# Patient Record
Sex: Female | Born: 1935
Health system: Southern US, Community
[De-identification: ages and names within clinical notes are randomized; demographics above are authoritative.]

## PROBLEM LIST (undated history)

## (undated) DIAGNOSIS — F419 Anxiety disorder, unspecified: Secondary | ICD-10-CM

## (undated) DIAGNOSIS — I509 Heart failure, unspecified: Secondary | ICD-10-CM

## (undated) DIAGNOSIS — I2510 Atherosclerotic heart disease of native coronary artery without angina pectoris: Secondary | ICD-10-CM

## (undated) HISTORY — PX: ABDOMINAL HYSTERECTOMY: SHX81

## (undated) HISTORY — PX: TONSILLECTOMY: SUR1361

---

## 1997-08-11 ENCOUNTER — Emergency Department (HOSPITAL_COMMUNITY): Admission: EM | Admit: 1997-08-11 | Discharge: 1997-08-11 | Payer: Self-pay | Admitting: Emergency Medicine

## 2004-07-02 ENCOUNTER — Ambulatory Visit: Payer: Self-pay | Admitting: Internal Medicine

## 2005-08-02 ENCOUNTER — Other Ambulatory Visit: Payer: Self-pay

## 2005-08-02 ENCOUNTER — Emergency Department: Payer: Self-pay | Admitting: Emergency Medicine

## 2005-10-14 ENCOUNTER — Ambulatory Visit: Payer: Self-pay | Admitting: Internal Medicine

## 2006-05-08 ENCOUNTER — Emergency Department: Payer: Self-pay | Admitting: Emergency Medicine

## 2006-09-18 ENCOUNTER — Emergency Department: Payer: Self-pay | Admitting: Emergency Medicine

## 2006-09-18 ENCOUNTER — Other Ambulatory Visit: Payer: Self-pay

## 2007-07-04 ENCOUNTER — Ambulatory Visit: Payer: Self-pay | Admitting: Internal Medicine

## 2007-07-12 ENCOUNTER — Ambulatory Visit: Payer: Self-pay | Admitting: Internal Medicine

## 2008-05-09 ENCOUNTER — Emergency Department: Payer: Self-pay | Admitting: Emergency Medicine

## 2010-07-23 ENCOUNTER — Emergency Department: Payer: Self-pay

## 2010-08-09 ENCOUNTER — Emergency Department: Payer: Self-pay | Admitting: Unknown Physician Specialty

## 2011-05-21 ENCOUNTER — Ambulatory Visit: Payer: Self-pay | Admitting: Gastroenterology

## 2012-05-20 ENCOUNTER — Emergency Department: Payer: Self-pay | Admitting: Emergency Medicine

## 2012-05-20 LAB — COMPREHENSIVE METABOLIC PANEL
Albumin: 3.6 g/dL (ref 3.4–5.0)
Alkaline Phosphatase: 55 U/L (ref 50–136)
Anion Gap: 5 — ABNORMAL LOW (ref 7–16)
BUN: 23 mg/dL — ABNORMAL HIGH (ref 7–18)
Bilirubin,Total: 0.2 mg/dL (ref 0.2–1.0)
Calcium, Total: 8.6 mg/dL (ref 8.5–10.1)
Co2: 32 mmol/L (ref 21–32)
EGFR (African American): >60
EGFR (Non-African Amer.): >60
Glucose: 99 mg/dL (ref 65–99)
Osmolality: 281 (ref 275–301)
SGPT (ALT): 21 U/L (ref 12–78)
Sodium: 139 mmol/L (ref 136–145)
Total Protein: 7.1 g/dL (ref 6.4–8.2)

## 2012-05-20 LAB — URINALYSIS, COMPLETE
Bilirubin,UR: NEGATIVE
Glucose,UR: NEGATIVE mg/dL (ref 0–75)
Hyaline Cast: 1
Ketone: NEGATIVE
Nitrite: NEGATIVE
Ph: 6 (ref 4.5–8.0)
Specific Gravity: 1.017 (ref 1.003–1.030)
WBC UR: <1 /HPF (ref 0–5)

## 2012-05-20 LAB — CBC
HCT: 35.1 % (ref 35.0–47.0)
MCH: 28.8 pg (ref 26.0–34.0)
MCHC: 33.2 g/dL (ref 32.0–36.0)
Platelet: 193 10*3/uL (ref 150–440)
RBC: 4.05 10*6/uL (ref 3.80–5.20)
RDW: 13.7 % (ref 11.5–14.5)
WBC: 5.1 10*3/uL (ref 3.6–11.0)

## 2013-06-26 LAB — CBC WITH DIFFERENTIAL/PLATELET
BASOS ABS: 0.1 10*3/uL (ref 0.0–0.1)
BASOS PCT: 1 %
Eosinophil #: 0.1 10*3/uL (ref 0.0–0.7)
Eosinophil %: 1 %
HCT: 40.6 % (ref 35.0–47.0)
HGB: 13.1 g/dL (ref 12.0–16.0)
LYMPHS ABS: 1.9 10*3/uL (ref 1.0–3.6)
Lymphocyte %: 33.7 %
MCH: 28.5 pg (ref 26.0–34.0)
MCHC: 32.3 g/dL (ref 32.0–36.0)
MCV: 88 fL (ref 80–100)
MONO ABS: 0.4 x10 3/mm (ref 0.2–0.9)
Monocyte %: 6.6 %
NEUTROS PCT: 57.7 %
Neutrophil #: 3.3 10*3/uL (ref 1.4–6.5)
Platelet: 212 10*3/uL (ref 150–440)
RBC: 4.6 10*6/uL (ref 3.80–5.20)
RDW: 13.9 % (ref 11.5–14.5)
WBC: 5.8 10*3/uL (ref 3.6–11.0)

## 2013-06-26 LAB — COMPREHENSIVE METABOLIC PANEL
AST: 24 U/L (ref 15–37)
Albumin: 3.8 g/dL (ref 3.4–5.0)
Alkaline Phosphatase: 58 U/L
Anion Gap: 8 (ref 7–16)
BUN: 22 mg/dL — ABNORMAL HIGH (ref 7–18)
Bilirubin,Total: 0.4 mg/dL (ref 0.2–1.0)
Calcium, Total: 9.6 mg/dL (ref 8.5–10.1)
Chloride: 102 mmol/L (ref 98–107)
Co2: 30 mmol/L (ref 21–32)
Creatinine: 0.92 mg/dL (ref 0.60–1.30)
EGFR (Non-African Amer.): >60
GLUCOSE: 96 mg/dL (ref 65–99)
Osmolality: 283 (ref 275–301)
POTASSIUM: 4.3 mmol/L (ref 3.5–5.1)
SGPT (ALT): 22 U/L (ref 12–78)
Sodium: 140 mmol/L (ref 136–145)
Total Protein: 7.9 g/dL (ref 6.4–8.2)

## 2013-06-26 LAB — TROPONIN I

## 2013-06-26 LAB — LIPASE, BLOOD: LIPASE: 127 U/L (ref 73–393)

## 2013-06-27 ENCOUNTER — Observation Stay: Payer: Self-pay | Admitting: Internal Medicine

## 2013-06-27 LAB — URINALYSIS, COMPLETE
Bilirubin,UR: NEGATIVE
Glucose,UR: NEGATIVE mg/dL (ref 0–75)
Ketone: NEGATIVE
NITRITE: NEGATIVE
PROTEIN: NEGATIVE
Ph: 6 (ref 4.5–8.0)
SPECIFIC GRAVITY: 1.046 (ref 1.003–1.030)
Squamous Epithelial: 1

## 2013-08-13 ENCOUNTER — Emergency Department: Payer: Self-pay | Admitting: Emergency Medicine

## 2013-08-13 LAB — URINALYSIS, COMPLETE
Bilirubin,UR: NEGATIVE
Glucose,UR: NEGATIVE mg/dL (ref 0–75)
Ketone: NEGATIVE
Leukocyte Esterase: NEGATIVE
NITRITE: NEGATIVE
PH: 5 (ref 4.5–8.0)
Protein: NEGATIVE
RBC,UR: <1 /HPF (ref 0–5)
Specific Gravity: 1.023 (ref 1.003–1.030)
Squamous Epithelial: 1
WBC UR: 3 /HPF (ref 0–5)

## 2013-08-13 LAB — COMPREHENSIVE METABOLIC PANEL
ALBUMIN: 3.7 g/dL (ref 3.4–5.0)
ANION GAP: 7 (ref 7–16)
Alkaline Phosphatase: 40 U/L — ABNORMAL LOW
BUN: 24 mg/dL — AB (ref 7–18)
Bilirubin,Total: 0.6 mg/dL (ref 0.2–1.0)
CHLORIDE: 101 mmol/L (ref 98–107)
Calcium, Total: 9.6 mg/dL (ref 8.5–10.1)
Co2: 31 mmol/L (ref 21–32)
Creatinine: 0.95 mg/dL (ref 0.60–1.30)
EGFR (African American): >60
GFR CALC NON AF AMER: 58 — AB
Glucose: 100 mg/dL — ABNORMAL HIGH (ref 65–99)
OSMOLALITY: 282 (ref 275–301)
POTASSIUM: 3.7 mmol/L (ref 3.5–5.1)
SGOT(AST): 23 U/L (ref 15–37)
SGPT (ALT): 26 U/L (ref 12–78)
SODIUM: 139 mmol/L (ref 136–145)
TOTAL PROTEIN: 7.1 g/dL (ref 6.4–8.2)

## 2013-08-13 LAB — CBC WITH DIFFERENTIAL/PLATELET
Basophil #: 0 10*3/uL (ref 0.0–0.1)
Basophil %: 0.9 %
EOS ABS: 0.1 10*3/uL (ref 0.0–0.7)
EOS PCT: 3.4 %
HCT: 36.4 % (ref 35.0–47.0)
HGB: 12 g/dL (ref 12.0–16.0)
LYMPHS PCT: 45.4 %
Lymphocyte #: 1.9 10*3/uL (ref 1.0–3.6)
MCH: 29 pg (ref 26.0–34.0)
MCHC: 32.9 g/dL (ref 32.0–36.0)
MCV: 88 fL (ref 80–100)
MONO ABS: 0.3 x10 3/mm (ref 0.2–0.9)
Monocyte %: 7.3 %
NEUTROS ABS: 1.8 10*3/uL (ref 1.4–6.5)
NEUTROS PCT: 43 %
Platelet: 219 10*3/uL (ref 150–440)
RBC: 4.13 10*6/uL (ref 3.80–5.20)
RDW: 13.4 % (ref 11.5–14.5)
WBC: 4.1 10*3/uL (ref 3.6–11.0)

## 2013-08-17 ENCOUNTER — Other Ambulatory Visit: Payer: Self-pay | Admitting: Emergency Medicine

## 2013-08-17 LAB — CLOSTRIDIUM DIFFICILE(ARMC)

## 2013-08-19 LAB — STOOL CULTURE

## 2013-11-26 ENCOUNTER — Emergency Department: Payer: Self-pay | Admitting: Emergency Medicine

## 2013-11-26 LAB — COMPREHENSIVE METABOLIC PANEL
ALT: 23 U/L
ANION GAP: 8 (ref 7–16)
AST: 21 U/L (ref 15–37)
Albumin: 3.7 g/dL (ref 3.4–5.0)
Alkaline Phosphatase: 52 U/L
BUN: 26 mg/dL — AB (ref 7–18)
Bilirubin,Total: 0.4 mg/dL (ref 0.2–1.0)
CO2: 26 mmol/L (ref 21–32)
CREATININE: 1.2 mg/dL (ref 0.60–1.30)
Calcium, Total: 8.9 mg/dL (ref 8.5–10.1)
Chloride: 103 mmol/L (ref 98–107)
EGFR (African American): 56 — ABNORMAL LOW
GFR CALC NON AF AMER: 46 — AB
Glucose: 125 mg/dL — ABNORMAL HIGH (ref 65–99)
OSMOLALITY: 280 (ref 275–301)
POTASSIUM: 3.7 mmol/L (ref 3.5–5.1)
Sodium: 137 mmol/L (ref 136–145)
Total Protein: 7.6 g/dL (ref 6.4–8.2)

## 2013-11-26 LAB — URINALYSIS, COMPLETE
Bacteria: NONE SEEN
Bilirubin,UR: NEGATIVE
Glucose,UR: NEGATIVE mg/dL (ref 0–75)
Ketone: NEGATIVE
Leukocyte Esterase: NEGATIVE
NITRITE: NEGATIVE
PH: 5 (ref 4.5–8.0)
Protein: NEGATIVE
Specific Gravity: 1.046 (ref 1.003–1.030)
Squamous Epithelial: 1
WBC UR: <1 /HPF (ref 0–5)

## 2013-11-26 LAB — CBC
HCT: 39.5 % (ref 35.0–47.0)
HGB: 12.4 g/dL (ref 12.0–16.0)
MCH: 28 pg (ref 26.0–34.0)
MCHC: 31.5 g/dL — AB (ref 32.0–36.0)
MCV: 89 fL (ref 80–100)
PLATELETS: 276 10*3/uL (ref 150–440)
RBC: 4.44 10*6/uL (ref 3.80–5.20)
RDW: 13 % (ref 11.5–14.5)
WBC: 9.4 10*3/uL (ref 3.6–11.0)

## 2013-11-26 LAB — CLOSTRIDIUM DIFFICILE(ARMC)

## 2013-11-26 LAB — LIPASE, BLOOD: Lipase: 499 U/L — ABNORMAL HIGH (ref 73–393)

## 2013-11-26 LAB — SEDIMENTATION RATE: Erythrocyte Sed Rate: 18 mm/hr (ref 0–30)

## 2013-11-28 LAB — STOOL CULTURE

## 2013-11-29 ENCOUNTER — Inpatient Hospital Stay: Payer: Self-pay | Admitting: Internal Medicine

## 2013-11-29 LAB — CBC WITH DIFFERENTIAL/PLATELET
Basophil #: 0.1 10*3/uL (ref 0.0–0.1)
Basophil %: 1.2 %
Eosinophil #: 0.1 10*3/uL (ref 0.0–0.7)
Eosinophil %: 1.1 %
HCT: 35.5 % (ref 35.0–47.0)
HGB: 11.3 g/dL — AB (ref 12.0–16.0)
LYMPHS ABS: 1.6 10*3/uL (ref 1.0–3.6)
LYMPHS PCT: 33.2 %
MCH: 28.1 pg (ref 26.0–34.0)
MCHC: 31.7 g/dL — AB (ref 32.0–36.0)
MCV: 89 fL (ref 80–100)
MONO ABS: 0.4 x10 3/mm (ref 0.2–0.9)
Monocyte %: 7.4 %
NEUTROS PCT: 57.1 %
Neutrophil #: 2.8 10*3/uL (ref 1.4–6.5)
Platelet: 246 10*3/uL (ref 150–440)
RBC: 4 10*6/uL (ref 3.80–5.20)
RDW: 12.9 % (ref 11.5–14.5)
WBC: 4.9 10*3/uL (ref 3.6–11.0)

## 2013-11-29 LAB — URINALYSIS, COMPLETE
Bacteria: NONE SEEN
Bilirubin,UR: NEGATIVE
Glucose,UR: NEGATIVE mg/dL (ref 0–75)
Hyaline Cast: 5
Ketone: NEGATIVE
NITRITE: NEGATIVE
Ph: 6 (ref 4.5–8.0)
Protein: NEGATIVE
SPECIFIC GRAVITY: 1.016 (ref 1.003–1.030)
Squamous Epithelial: <1
WBC UR: 1 /HPF (ref 0–5)

## 2013-11-29 LAB — BASIC METABOLIC PANEL
Anion Gap: 6 — ABNORMAL LOW (ref 7–16)
BUN: 16 mg/dL (ref 7–18)
CREATININE: 1.58 mg/dL — AB (ref 0.60–1.30)
Calcium, Total: 8.8 mg/dL (ref 8.5–10.1)
Chloride: 103 mmol/L (ref 98–107)
Co2: 31 mmol/L (ref 21–32)
EGFR (African American): 41 — ABNORMAL LOW
GFR CALC NON AF AMER: 34 — AB
Glucose: 110 mg/dL — ABNORMAL HIGH (ref 65–99)
Osmolality: 281 (ref 275–301)
Potassium: 3.3 mmol/L — ABNORMAL LOW (ref 3.5–5.1)
SODIUM: 140 mmol/L (ref 136–145)

## 2013-11-29 LAB — TROPONIN I: Troponin-I: <0.02 ng/mL

## 2013-11-29 LAB — LIPASE, BLOOD: Lipase: 168 U/L (ref 73–393)

## 2013-11-30 LAB — CBC WITH DIFFERENTIAL/PLATELET
BASOS PCT: 0.9 %
Basophil #: 0 10*3/uL (ref 0.0–0.1)
EOS PCT: 1.9 %
Eosinophil #: 0.1 10*3/uL (ref 0.0–0.7)
HCT: 33.8 % — ABNORMAL LOW (ref 35.0–47.0)
HGB: 10.7 g/dL — ABNORMAL LOW (ref 12.0–16.0)
LYMPHS PCT: 42.5 %
Lymphocyte #: 2.2 10*3/uL (ref 1.0–3.6)
MCH: 28.3 pg (ref 26.0–34.0)
MCHC: 31.8 g/dL — AB (ref 32.0–36.0)
MCV: 89 fL (ref 80–100)
MONO ABS: 0.4 x10 3/mm (ref 0.2–0.9)
Monocyte %: 8.1 %
Neutrophil #: 2.4 10*3/uL (ref 1.4–6.5)
Neutrophil %: 46.6 %
Platelet: 214 10*3/uL (ref 150–440)
RBC: 3.8 10*6/uL (ref 3.80–5.20)
RDW: 12.9 % (ref 11.5–14.5)
WBC: 5.1 10*3/uL (ref 3.6–11.0)

## 2013-11-30 LAB — BASIC METABOLIC PANEL
Anion Gap: 4 — ABNORMAL LOW (ref 7–16)
BUN: 18 mg/dL (ref 7–18)
CHLORIDE: 108 mmol/L — AB (ref 98–107)
CREATININE: 1.6 mg/dL — AB (ref 0.60–1.30)
Calcium, Total: 8.3 mg/dL — ABNORMAL LOW (ref 8.5–10.1)
Co2: 31 mmol/L (ref 21–32)
GFR CALC AF AMER: 40 — AB
GFR CALC NON AF AMER: 33 — AB
GLUCOSE: 90 mg/dL (ref 65–99)
Osmolality: 286 (ref 275–301)
Potassium: 3.2 mmol/L — ABNORMAL LOW (ref 3.5–5.1)
SODIUM: 143 mmol/L (ref 136–145)

## 2013-12-01 LAB — URINE CULTURE

## 2013-12-11 ENCOUNTER — Ambulatory Visit: Payer: Self-pay | Admitting: Gastroenterology

## 2013-12-17 ENCOUNTER — Ambulatory Visit: Payer: Self-pay | Admitting: Gastroenterology

## 2014-03-15 ENCOUNTER — Other Ambulatory Visit: Payer: Self-pay | Admitting: Gastroenterology

## 2014-03-15 DIAGNOSIS — K5710 Diverticulosis of small intestine without perforation or abscess without bleeding: Secondary | ICD-10-CM

## 2014-03-27 ENCOUNTER — Telehealth: Payer: Self-pay | Admitting: Diagnostic Radiology

## 2014-03-27 ENCOUNTER — Ambulatory Visit
Admission: RE | Admit: 2014-03-27 | Discharge: 2014-03-27 | Disposition: A | Discharge status: Home / Self Care | Payer: Commercial Managed Care - HMO | Source: Ambulatory Visit | Attending: Gastroenterology | Admitting: Gastroenterology

## 2014-03-27 DIAGNOSIS — K5710 Diverticulosis of small intestine without perforation or abscess without bleeding: Secondary | ICD-10-CM

## 2014-05-19 ENCOUNTER — Emergency Department: Payer: Self-pay | Admitting: Emergency Medicine

## 2014-05-19 DIAGNOSIS — R001 Bradycardia, unspecified: Secondary | ICD-10-CM | POA: Diagnosis not present

## 2014-05-19 DIAGNOSIS — R1031 Right lower quadrant pain: Secondary | ICD-10-CM | POA: Diagnosis not present

## 2014-05-19 DIAGNOSIS — R61 Generalized hyperhidrosis: Secondary | ICD-10-CM | POA: Diagnosis not present

## 2014-05-19 DIAGNOSIS — R112 Nausea with vomiting, unspecified: Secondary | ICD-10-CM | POA: Diagnosis not present

## 2014-05-19 DIAGNOSIS — R1032 Left lower quadrant pain: Secondary | ICD-10-CM | POA: Diagnosis not present

## 2014-05-19 DIAGNOSIS — R1110 Vomiting, unspecified: Secondary | ICD-10-CM | POA: Diagnosis not present

## 2014-05-19 DIAGNOSIS — K5730 Diverticulosis of large intestine without perforation or abscess without bleeding: Secondary | ICD-10-CM | POA: Diagnosis not present

## 2014-05-19 DIAGNOSIS — Z79899 Other long term (current) drug therapy: Secondary | ICD-10-CM | POA: Diagnosis not present

## 2014-05-19 DIAGNOSIS — R51 Headache: Secondary | ICD-10-CM | POA: Diagnosis not present

## 2014-05-19 DIAGNOSIS — R109 Unspecified abdominal pain: Secondary | ICD-10-CM | POA: Diagnosis not present

## 2014-05-19 DIAGNOSIS — R197 Diarrhea, unspecified: Secondary | ICD-10-CM | POA: Diagnosis not present

## 2014-05-19 DIAGNOSIS — Z791 Long term (current) use of non-steroidal anti-inflammatories (NSAID): Secondary | ICD-10-CM | POA: Diagnosis not present

## 2014-05-19 DIAGNOSIS — Z7982 Long term (current) use of aspirin: Secondary | ICD-10-CM | POA: Diagnosis not present

## 2014-06-13 DIAGNOSIS — E780 Pure hypercholesterolemia: Secondary | ICD-10-CM | POA: Diagnosis not present

## 2014-06-13 DIAGNOSIS — R1013 Epigastric pain: Secondary | ICD-10-CM | POA: Diagnosis not present

## 2014-06-13 DIAGNOSIS — R1084 Generalized abdominal pain: Secondary | ICD-10-CM | POA: Diagnosis not present

## 2014-06-13 DIAGNOSIS — Z79899 Other long term (current) drug therapy: Secondary | ICD-10-CM | POA: Diagnosis not present

## 2014-06-13 DIAGNOSIS — I10 Essential (primary) hypertension: Secondary | ICD-10-CM | POA: Diagnosis not present

## 2014-06-22 NOTE — Discharge Summary (Signed)
PATIENT NAME:  Teresa Holt, Harmony S MR#:  [id number] DATE OF BIRTH:  1934/09/16  DATE OF ADMISSION:  11/29/2013 DATE OF DISCHARGE:  11/30/2013  ADMISSION DIAGNOSES:  1.  Acute kidney injury.  2.  Altered mental status.    DISCHARGE DIAGNOSES:   1.  Metabolic encephalopathy secondary to dehydration, acute kidney injury, as well as pain medications recently prescribed.  2.  Acute kidney injury.  3.  Acute cystitis, no hematuria.  4.  Abdominal pain, resolved.  5.  Hypokalemia.   CONSULTATIONS: None.   LABORATORIES AT DISCHARGE: White blood cells 5.1, hemoglobin 10.7, hematocrit 34, platelets are 214,000. Sodium 143, potassium 3.2, chloride 108, bicarbonate 31, BUN 18, creatinine 1.6, glucose of 90, calcium 8.3.   HOSPITAL COURSE: This is a 80 year old female who presented 3 days prior to admission to the ER with abdominal pain, found to have a possible inflammatory bowel disease, was sent home on antibiotics as well as pain medications and antiemetics. She presented with metabolic encephalopathy. For further details please refer to the H and P.    1.  Metabolic encephalopathy, likely combination of dehydration and pain medications and her mild cystitis. Her symptoms have resolved. CT of the head was negative. She is back to her baseline. I did ambulate the patient myself, the patient is ambulating well, not complaining of dizziness or lightheadedness. She is oriented x 3.  2.  Acute kidney injury from dehydration. Her creatinine has not much improved, but she was dehydrated. We will continue IV fluids. She will have a repeat BMP next week with her PCP. 3.  Acute cystitis, no hematuria. The patient is already on antibiotics that she was administered in the ER, so she will continue on these antibiotics.  4.  Abdominal pain, resolved. The patient had a CT a few days ago with possible Crohn disease and she will follow up with GI.  5.  Hypokalemia, which was repleted prior to discharge.   DISCHARGE  MEDICATIONS:  1.  Metoprolol 50 mg daily.  2.  Ciprofloxacin 500 q. 12 hours for 10 days.  3.  Flagyl 500 mg q. 8 hours for 10 days.   DISCHARGE DIET: Low sodium.   DISCHARGE ACTIVITY: As tolerated.   DISCHARGE FOLLOWUP:  1.  The patient has a follow-up next week with GI.  2.  She will need to follow up next week with Dr. Barry BrunnerGlenn Willett for a BMP.   TIME SPENT: Approximately 35 minutes.   Plan of care was discussed with the patient's family. The patient is stable for discharge.    ____________________________ Aboubacar Matsuo P. Juliene PinaMody, MD spm:bu D: 11/30/2013 13:27:26 ET T: 11/30/2013 15:56:59 ET JOB#: [id number]  cc: Maliek Schellhorn P. Juliene PinaMody, MD, <Dictator> Jorje GuildGlenn R. Beckey DowningWillett, MD Scot Junobert T. Elliott, MD  Jonthan Leite P Esmond Hinch MD ELECTRONICALLY SIGNED 11/30/2013 20:05

## 2014-06-22 NOTE — H&P (Signed)
PATIENT NAME:  Teresa Holt, Teresa Holt MR#:  [id number] DATE OF BIRTH:  1934/12/10  DATE OF ADMISSION:  11/29/2013  PRIMARY CARE PHYSICIAN: Dr. Barry Brunner.    REFERRING EMERGENCY ROOM PHYSICIAN: Dr. Mayford Knife.    CHIEF COMPLAINT: Altered mental status.   HISTORY OF PRESENT ILLNESS: The patient is a 80 year old pleasant African-American female who is brought into the ED by her husband for altered mental status. Actually the patient was seen by the ED physician approximately 3 days ago on last Monday for abdominal pain and a CAT scan of the abdomen and pelvis was done at that time. CT performed on September 28 has revealed irregular wall thickening and mucosal enhancement of numerous distal ileal loops consistent with small bowel inflammation and there was a concern for Crohn disease. The patient was started on Flagyl and ciprofloxacin and she was sent home. Since the patient was seen and started on this medications the family noticed her being altered.  They were concerned that the patient has intermittent episodes of confusion. There are also reporting that the patient is not eating much, had some abdominal pain and decreased p.o. intake. As the patient is not doing good she is brought into the ED.  The patient was feeling weak and having difficulty to stand up. CAT scan of the head was done which revealed no acute findings. The patient's creatinine is elevated which was at 1.20 on September 20, today it is at 1.58, her potassium is low at 3.3. The urinalysis has revealed trace leukocyte esterase and negative nitrate. Her stool for Clostridium difficile toxin performed on September 20 was found to be negative. Hospitalist team is called to admit the patient regarding altered mental status.  During my examination the patient is not complaining of any abdominal pain, but the family is concerned about her confusion. The patient is communicating and answering questions appropriately, but with low voice during my  examination.   PAST MEDICAL HISTORY: CVA without any residual deficits, hypertension, congestive heart failure with undocumented EF.   PAST SURGICAL HISTORY: None.   ALLERGIES: No known drug allergies.   PSYCHOSOCIAL HISTORY: Lives at home with husband. No history of smoking, alcohol, or illicit drug usage.   FAMILY HISTORY: Positive for diabetes as well as stroke.    REVIEW OF SYSTEMS:   CONSTITUTIONAL:  Denies any fever. Complaining of fatigue and weakness.  EYES: Denies blurry vision, double vision, glaucoma.  ENT: Denies epistaxis, discharge.  RESPIRATION: Denies cough, COPD.   CARDIOVASCULAR: No chest pain, palpitations, syncope.  GASTROINTESTINAL: Denies nausea, vomiting, diarrhea, abdominal pain. No hematemesis, no melena.  GENITOURINARY: No dysuria or hematuria.  ENDOCRINE: Denies polyuria, nocturia, thyroid problems.  GYNECOLOGIC AND BREASTS:  Denies breast masses or vaginal discharge.  HEMATOLOGIC:  No anemia.  No easy bruising or bleeding.  INTEGUMENTARY: No acne, rash, lesions.  MUSCULOSKELETAL: No joint pain in the neck and back. Denies gout.   NEUROLOGIC;  Denies any vertigo, ataxia. Complaining of weakness.  PSYCHIATRIC: No ADD, OCD.     HOME MEDICATIONS: Metoprolol succinate 50 mg p.o. once daily, ciprofloxacin 500 mg p.o. q. 12 hours, Tylenol 1-2 tablets every 4-6 hours as needed, metronidazole 500 mg p.o. q. 8 hours, promethazine 25 mg 1-2 tablets p.o. q. 4-6 hours as needed for nausea and vomiting.   PHYSICAL EXAMINATION:  VITAL SIGNS: Temperature 97.9, pulse 54, respirations 18, blood pressure 116/74, pulse oximetry is 98%.  GENERAL APPEARANCE: Not in acute distress. Moderately built and thin-looking female in no apparent distress.  HEENT: Normocephalic, atraumatic. Pupils are equally reacting to light and accommodation. No scleral icterus. No conjunctival injection. No sinus tenderness. No postnasal drip. Dry mucus membranes.  NECK: Supple. No JVD. No  thyromegaly. Range of motion is intact.  LUNGS: Clear to auscultation bilaterally. No accessory muscle use. No anterior chest wall tenderness on palpation.  CARDIAC: S1, S2 normal. Regular rate and rhythm. No murmurs. GASTROINTESTINAL: Soft. Bowel sounds are positive in all 4 quadrants. Nontender, nondistended. No masses, no hepatosplenomegaly. No masses felt.   NEUROLOGIC:  Awake and alert, oriented x 3. Answering questions appropriately. Is complaining of generalized weakness. Reflexes are 2 +. Motor and sensory are grossly intact. EXTREMITIES: No edema. No cyanosis. No clubbing.  SKIN: Warm to touch. Decreased turgor.  No rashes or lesions.   LABORATORY AND IMAGING STUDIES: CAT scan of the head, no acute findings. Troponin less than 0.02. WBC 4.9, hemoglobin is 11.3, hematocrit and platelets are normal. Stool for Clostridium difficile toxin performed on September 20 is negative. Urinalysis yellow in color, blood is 1 +, nitrates negative, leukocyte esterase is trace. BMP reveals a glucose 110, BUN 16, creatinine 1.58, sodium 140, potassium 3.3, chloride 103, CO2 31. Anion gap is 6. Serum osmolality and calcium are normal. Lipase which was elevated at 499 on September 20 is back to normal at 168.   ASSESSMENT AND PLAN:  A 80 year old African-American female brought into the Emergency Department by family members for altered mental status, who was evaluated by Emergency Department physician 3 days ago for abdominal pain and notices some small bowel inflammation concerning for Crohn disease, was sent home with p.o. antibiotics Cipro and Flagyl, will be admitted with the following assessment and plan. A CAT scan of the head is negative.   ASSESSMENT AND PLAN:  1.  Altered mental status secondary to dehydration and acute cystitis.  2.  Dehydration secondary to poor p.o. intake leading to acute kidney injury. We will provide gentle hydration with IV fluids and monitor fluid overload in view of her  congestive heart failure.  3.  Acute cystitis by urinalysis and small bowel inflammatory disease with a concern for Crohn disease.  We will get urine culture. The patient will be continued on IV levofloxacin and Flagyl.  4.  History of hypertension. Blood pressure is stable.  We will resume her home medications. 5.  Chronic congestive heart failure, ejection fraction is not documented, not fluid overloaded at this time.  6.  We will provide GI and DVT prophylaxis.   She is full code. Her husband and daughter are the medical power of attorney. Plan of care was discussed in detail with the patient and her husband at bedside. They both verbalized understanding of the plan.   TOTAL TIME SPENT ON ADMISSION: 45 minutes.     ____________________________ Ramonita LabAruna Alura Olveda, MD ag:bu D: 11/29/2013 19:27:25 ET T: 11/29/2013 20:22:15 ET JOB#: [id number]  cc: Ramonita LabAruna Sira Adsit, MD, <Dictator> Jorje GuildGlenn R. Beckey DowningWillett, MD Ramonita LabARUNA Evaline Waltman MD ELECTRONICALLY SIGNED 11/30/2013 14:02

## 2014-06-22 NOTE — Discharge Summary (Signed)
PATIENT NAME:  Teresa Holt, Teresa Holt MR#:  [id number] DATE OF BIRTH:  03-25-1934  DATE OF ADMISSION:  06/27/2013 DATE OF DISCHARGE:  06/27/2013  ADMITTING PHYSICIAN: Dr. Angelica Ranavid Hower   DISCHARGING PHYSICIAN: Dr. Enid Baasadhika Jaquetta Currier  PRIMARY CARE PHYSICIAN: Dr. Barry BrunnerGlenn Willett   CONSULTATIONS IN HOSPITAL: None.   DISCHARGE DIAGNOSES:  1. Intractable nausea, vomiting, and diarrhea secondary to acute colitis.  2. Hypertension.   DISCHARGE HOME MEDICATIONS:  1. Vitamin C 500 mg p.o. daily.  2. Clonidine 0.1 mg p.o. 3 times a day.  3. Lovastatin 20 mg p.o. daily.  4. Aspirin 81 mg p.o. daily.  5. Metoprolol 50 mg p.o. daily.  6. HCTZ 12.5 mg p.o. daily.  7. Flagyl 500 mg p.o. q. 8 hours for 9 days.  8. Ciprofloxacin 500 mg p.o. b.i.d. for 9 days.  9. Tramadol 50 mg p.o. q. 6 hours p.r.n. for pain.  10. Phenergan 25 mg q. 6 hours p.r.n. for nausea and vomiting.   DISCHARGE DIET: Low-sodium diet.   DISCHARGE ACTIVITY: As tolerated.    FOLLOWUP INSTRUCTIONS: PCP followup in 1-2 weeks.   LABS AND IMAGING STUDIES PRIOR TO DISCHARGE: Urinalysis is negative for any infection. CT of the abdomen and pelvis with contrast showing changes in descending portions of the colon suggests colitis, also sigmoid diverticulosis without diverticulitis. No evidence of obstruction seen. No hepatobiliary abnormality and stable cysts in both kidneys seen.   WBC 5.8, hemoglobin 13.1, hematocrit 40.6, platelet count 212.   Sodium 140, potassium 4.3, chloride 102, bicarbonate 30, BUN 22, creatinine 0.92, glucose 96, and calcium of 9.6.   ALT 22, AST 24, alkaline phosphatase 58, total bilirubin 0.4, albumin of 3.8, lipase is 127.   BRIEF HOSPITAL COURSE: The patient is a 80 year old obese female with past medical history significant for hypertension, a prior history of CVA without any residual neurological deficits presents to the hospital with  intractable nausea, vomiting, abdominal pain. CT of the abdomen did reveal  that she has acute colitis.  1. Acute colitis with intractable nausea, vomiting and diarrhea, and also abdominal pain. She was started on IV fluids and Cipro and Flagyl. Symptoms improved while in the hospital so is being discharged home. She was able to tolerate a regular diet prior to discharge. She had 1 diarrheal episode where she had some streaks of blood. Could be from hemorrhoids.  2. Hypertension.  All her home medications are being continued at the time of discharge.  3. Hyperlipemia. The patient takes statin at home.  Her course has been otherwise uneventful in the hospital.   DISCHARGE CONDITION: Stable.   DISCHARGE DISPOSITION: Home.   TIME SPENT ON DISCHARGE: 45 minutes.    ____________________________ Enid Baasadhika Zeya Balles, MD rk:dd D: 06/27/2013 14:23:40 ET T: 06/27/2013 19:07:21 ET JOB#: [id number]  cc: Enid Baasadhika Zelta Enfield, MD, <Dictator> Jorje GuildGlenn R. Beckey DowningWillett, MD  Enid BaasADHIKA Emerita Berkemeier MD ELECTRONICALLY SIGNED 07/18/2013 13:59

## 2014-06-22 NOTE — H&P (Signed)
PATIENT NAME:  Teresa Holt, Teresa Holt MR#:  [id number] DATE OF BIRTH:  [birth year]  DATE OF ADMISSION:  06/27/2013  REFERRING PHYSICIAN: Dr. Leanord AsalNormon  PRIMARY CARE PHYSICIAN: Dr. Beckey DowningWillett.   CHIEF COMPLAINT: Abdominal pain.    HISTORY OF PRESENT ILLNESS: This is a 80 year old African American female with a past medical history of hypertension, presenting with abdominal pain. She describes one month duration of intermittent abdominal pain, periumbilical in location, bloating in quality, however, for the last one day it has been persistent, 10 out of 10 in intensity, bloating and cramping, without radiation. No worsening or relieving factors. No associated fevers or chills. However, has had associated diarrhea, which she describes multiple bouts of watery diarrhea with nausea and emesis. A few bouts of vomiting of nonbloody, nonbilious emesis. In the Emergency Department, apparently she was scheduled to be discharged; however, had repeated bouts of emesis and nausea, with inability to tolerate p.o.   REVIEW OF SYSTEMS:  CONSTITUTIONAL: Positive for fatigue, weakness. Denies fevers, chills. EYES: Denies blurry vision, double vision, eye pain.  HEENT: Denies tinnitus, ear pain, hearing loss.  RESPIRATORY: Denies cough, wheeze, shortness of breath.  CARDIOVASCULAR: Denies chest pain, palpitations, edema.  GASTROINTESTINAL: Positive for nausea, vomiting, diarrhea, abdominal pain, as described above.  GENITOURINARY: Denies dysuria, hematuria.  ENDOCRINE: Denies nocturia or thyroid problems.  HEMATOLOGY AND LYMPHATIC: Denies easy bruising, bleeding. SKIN: Denies rash or lesion.  MUSCULOSKELETAL: Denies pain in neck, back, shoulder, hips or arthritic symptoms. NEUROLOGIC: Denies paralysis, paresthesias. PSYCHIATRIC: Denies anxiety or depressive symptoms. Otherwise, full review of systems performed by me is negative.   PAST MEDICAL HISTORY: CVA without residual deficits, hypertension, congestive heart failure,  with undocumented ejection fraction.   SOCIAL HISTORY: Denies alcohol, tobacco or drug usage.   FAMILY HISTORY: Positive for diabetes as well as stroke.   ALLERGIES: No known drug allergies.   HOME MEDICATIONS: Include aspirin 81 mg p.o. daily, tramadol 50 mg p.o. q.4h. as needed for pain, clonidine 0.1 mg p.o. 3 times daily, lovastatin 20 mg p.o. daily, metoprolol 50 mg p.o. daily, hydrochlorothiazide 12.5 mg p.o. daily, vitamin C 500 mg p.o. daily.   PHYSICAL EXAMINATION: VITAL SIGNS: Temperature 98, heart rate 58, respirations 20, blood pressure 174/95, satting 100% on room air. Weight 90.7 kg, BMI 32.3.  GENERAL: Obese Caucasian female who appears weak at this time, though in no acute distress.  HEAD: Normocephalic, atraumatic.  EYES: Pupils equal, round, reactive to light. Extraocular muscles intact. No scleral icterus.  MOUTH: Dry mucosal membrane. Dentition intact. No abscess noted.  EARS, NOSE, AND THROAT: Throat clear, without exudates. No external lesions.  NECK: Supple. No thyromegaly. No nodules. No JVD.  PULMONARY: Clear to auscultation bilaterally, without wheeze, rales, rhonchi. No accessory muscle use. Good respiratory effort. CHEST: Nontender to palpation.  CARDIOVASCULAR: S1, S2. Regular rate and rhythm. No murmurs, rubs or gallops. No edema. Pulses 2+ bilaterally.  GASTROINTESTINAL: Soft, nondistended, tender to palpation, periumbilical and suprapubic regions, without rebound, guarding or motion tenderness. Positive bowel sounds. No hepatosplenomegaly.  MUSCULOSKELETAL: No swelling, clubbing, or edema. Range of motion full in all extremities.  NEUROLOGIC: Cranial nerves II through XII intact. No gross focal neurologic deficit. Sensation intact, reflexes intact.  SKIN: No ulcerations, lesions, rash, cyanosis. Skin warm, dry. Turgor intact.  PSYCHIATRIC: Mood and affect within normal limits. The patient is awake, alert and oriented x3. Insight and judgment intact.    LABORATORY DATA: Sodium 140, potassium 4.3, chloride 102, bicarbonate 22, BUN 0.9, glucose 96. LFTs within normal  limits. Troponin I less than 0.02. WBC 5.8, hemoglobin 13.1, platelets of 212. Urinalysis negative for evidence of infection. Lactic acid 1.1. CT abdomen and pelvis performed with contrast, revealing transverse and descending portions of the colon suggestive of colitis, without acute evidence of diverticulitis; however, there is mention of diverticulosis.   ASSESSMENT AND PLAN: A 80 year old [place] American female with a history of hypertension, presenting with abdominal pain, describing what has been intermittent, with associated diarrhea and current emesis. She was doing fairly well in the Emergency Department, however, prior to discharge had recurrent nausea, vomiting and diarrhea, making her intolerant of oral medications.  1.  Intractable nausea, vomiting, diarrhea. We will provide IV fluid hydration with normal saline. We will provide supportive care with Zofran.  2.  Colitis. Place on antibiotic coverage with Cipro, Flagyl.  3.  Hypertension. Will hold hydrochlorothiazide for now, given diarrhea and vomiting. Will continue her Lopressor, as well as clonidine.  4.  Hyperlipidemia. Continue with her statin therapy.  5.  Deep venous thrombosis prophylaxis with heparin subcutaneous.   CODE STATUS: The patient is full code.   TIME SPENT: 45 minutes.   ____________________________ Cletis Athens. Hower, MD dkh:cg D: 06/27/2013 00:53:02 ET T: 06/27/2013 01:41:40 ET JOB#: [id number]  cc: Cletis Athens. Hower, MD, <Dictator> DAVID Synetta Shadow MD ELECTRONICALLY SIGNED 07/04/2013 20:25

## 2014-06-24 LAB — SURGICAL PATHOLOGY

## 2014-08-16 DIAGNOSIS — K5790 Diverticulosis of intestine, part unspecified, without perforation or abscess without bleeding: Secondary | ICD-10-CM | POA: Diagnosis not present

## 2014-08-16 DIAGNOSIS — R109 Unspecified abdominal pain: Secondary | ICD-10-CM | POA: Diagnosis not present

## 2014-08-16 DIAGNOSIS — I10 Essential (primary) hypertension: Secondary | ICD-10-CM | POA: Diagnosis not present

## 2014-08-16 DIAGNOSIS — K580 Irritable bowel syndrome with diarrhea: Secondary | ICD-10-CM | POA: Diagnosis not present

## 2014-08-16 DIAGNOSIS — E785 Hyperlipidemia, unspecified: Secondary | ICD-10-CM | POA: Diagnosis not present

## 2014-08-16 DIAGNOSIS — M47819 Spondylosis without myelopathy or radiculopathy, site unspecified: Secondary | ICD-10-CM | POA: Diagnosis not present

## 2014-08-16 DIAGNOSIS — G8929 Other chronic pain: Secondary | ICD-10-CM | POA: Diagnosis not present

## 2014-08-16 DIAGNOSIS — Z90710 Acquired absence of both cervix and uterus: Secondary | ICD-10-CM | POA: Diagnosis not present

## 2014-08-16 DIAGNOSIS — K219 Gastro-esophageal reflux disease without esophagitis: Secondary | ICD-10-CM | POA: Diagnosis not present

## 2014-12-19 DIAGNOSIS — Z79899 Other long term (current) drug therapy: Secondary | ICD-10-CM | POA: Diagnosis not present

## 2014-12-19 DIAGNOSIS — Z23 Encounter for immunization: Secondary | ICD-10-CM | POA: Diagnosis not present

## 2014-12-19 DIAGNOSIS — E7800 Pure hypercholesterolemia, unspecified: Secondary | ICD-10-CM | POA: Diagnosis not present

## 2014-12-19 DIAGNOSIS — Z0000 Encounter for general adult medical examination without abnormal findings: Secondary | ICD-10-CM | POA: Diagnosis not present

## 2015-03-28 DIAGNOSIS — I10 Essential (primary) hypertension: Secondary | ICD-10-CM | POA: Diagnosis not present

## 2015-03-28 DIAGNOSIS — K5732 Diverticulitis of large intestine without perforation or abscess without bleeding: Secondary | ICD-10-CM | POA: Diagnosis not present

## 2015-03-28 DIAGNOSIS — K5730 Diverticulosis of large intestine without perforation or abscess without bleeding: Secondary | ICD-10-CM | POA: Diagnosis not present

## 2015-03-28 DIAGNOSIS — I700 Atherosclerosis of aorta: Secondary | ICD-10-CM | POA: Diagnosis not present

## 2015-03-28 DIAGNOSIS — M8580 Other specified disorders of bone density and structure, unspecified site: Secondary | ICD-10-CM | POA: Diagnosis not present

## 2015-03-28 DIAGNOSIS — Z90710 Acquired absence of both cervix and uterus: Secondary | ICD-10-CM | POA: Diagnosis not present

## 2015-03-28 DIAGNOSIS — Z79899 Other long term (current) drug therapy: Secondary | ICD-10-CM | POA: Diagnosis not present

## 2015-03-28 DIAGNOSIS — M47819 Spondylosis without myelopathy or radiculopathy, site unspecified: Secondary | ICD-10-CM | POA: Diagnosis not present

## 2015-03-28 DIAGNOSIS — E785 Hyperlipidemia, unspecified: Secondary | ICD-10-CM | POA: Diagnosis not present

## 2015-03-28 DIAGNOSIS — K219 Gastro-esophageal reflux disease without esophagitis: Secondary | ICD-10-CM | POA: Diagnosis not present

## 2015-03-28 DIAGNOSIS — R1032 Left lower quadrant pain: Secondary | ICD-10-CM | POA: Diagnosis not present

## 2015-04-03 DIAGNOSIS — K5792 Diverticulitis of intestine, part unspecified, without perforation or abscess without bleeding: Secondary | ICD-10-CM | POA: Diagnosis not present

## 2015-06-19 DIAGNOSIS — I10 Essential (primary) hypertension: Secondary | ICD-10-CM | POA: Diagnosis not present

## 2015-06-19 DIAGNOSIS — Z79899 Other long term (current) drug therapy: Secondary | ICD-10-CM | POA: Diagnosis not present

## 2015-06-19 DIAGNOSIS — K219 Gastro-esophageal reflux disease without esophagitis: Secondary | ICD-10-CM | POA: Diagnosis not present

## 2015-06-19 DIAGNOSIS — E7800 Pure hypercholesterolemia, unspecified: Secondary | ICD-10-CM | POA: Diagnosis not present

## 2015-10-02 ENCOUNTER — Emergency Department: Payer: Commercial Managed Care - HMO

## 2015-10-02 ENCOUNTER — Emergency Department
Admission: EM | Admit: 2015-10-02 | Discharge: 2015-10-02 | Disposition: A | Discharge status: Home / Self Care | Payer: Commercial Managed Care - HMO | Attending: Emergency Medicine | Admitting: Emergency Medicine

## 2015-10-02 ENCOUNTER — Encounter: Payer: Self-pay | Admitting: Emergency Medicine

## 2015-10-02 DIAGNOSIS — R1013 Epigastric pain: Secondary | ICD-10-CM | POA: Diagnosis not present

## 2015-10-02 DIAGNOSIS — I2510 Atherosclerotic heart disease of native coronary artery without angina pectoris: Secondary | ICD-10-CM | POA: Diagnosis not present

## 2015-10-02 DIAGNOSIS — I509 Heart failure, unspecified: Secondary | ICD-10-CM | POA: Diagnosis not present

## 2015-10-02 DIAGNOSIS — K5730 Diverticulosis of large intestine without perforation or abscess without bleeding: Secondary | ICD-10-CM | POA: Diagnosis not present

## 2015-10-02 HISTORY — DX: Heart failure, unspecified: I50.9

## 2015-10-02 HISTORY — DX: Atherosclerotic heart disease of native coronary artery without angina pectoris: I25.10

## 2015-10-02 HISTORY — DX: Anxiety disorder, unspecified: F41.9

## 2015-10-02 LAB — URINALYSIS COMPLETE WITH MICROSCOPIC (ARMC ONLY)
Bacteria, UA: NONE SEEN
Bilirubin Urine: NEGATIVE
Glucose, UA: NEGATIVE mg/dL
KETONES UR: NEGATIVE mg/dL
LEUKOCYTES UA: NEGATIVE
NITRITE: NEGATIVE
PROTEIN: NEGATIVE mg/dL
SPECIFIC GRAVITY, URINE: 1.026 (ref 1.005–1.030)
WBC UA: NONE SEEN WBC/hpf (ref 0–5)
pH: 6 (ref 5.0–8.0)

## 2015-10-02 LAB — CBC
HEMATOCRIT: 36.6 % (ref 35.0–47.0)
HEMOGLOBIN: 12.4 g/dL (ref 12.0–16.0)
MCH: 30.1 pg (ref 26.0–34.0)
MCHC: 33.8 g/dL (ref 32.0–36.0)
MCV: 89.1 fL (ref 80.0–100.0)
Platelets: 171 10*3/uL (ref 150–440)
RBC: 4.1 MIL/uL (ref 3.80–5.20)
RDW: 13.5 % (ref 11.5–14.5)
WBC: 4.3 10*3/uL (ref 3.6–11.0)

## 2015-10-02 LAB — COMPREHENSIVE METABOLIC PANEL
ALT: 11 U/L — ABNORMAL LOW (ref 14–54)
AST: 21 U/L (ref 15–41)
Albumin: 4 g/dL (ref 3.5–5.0)
Alkaline Phosphatase: 36 U/L — ABNORMAL LOW (ref 38–126)
Anion gap: 6 (ref 5–15)
BUN: 22 mg/dL — AB (ref 6–20)
CHLORIDE: 105 mmol/L (ref 101–111)
CO2: 29 mmol/L (ref 22–32)
Calcium: 9 mg/dL (ref 8.9–10.3)
Creatinine, Ser: 0.8 mg/dL (ref 0.44–1.00)
GFR calc Af Amer: >60 mL/min (ref >60)
GFR calc non Af Amer: >60 mL/min (ref >60)
GLUCOSE: 105 mg/dL — AB (ref 65–99)
Potassium: 4.3 mmol/L (ref 3.5–5.1)
Sodium: 140 mmol/L (ref 135–145)
Total Bilirubin: 0.8 mg/dL (ref 0.3–1.2)
Total Protein: 7 g/dL (ref 6.5–8.1)

## 2015-10-02 LAB — LIPASE, BLOOD: Lipase: 21 U/L (ref 11–51)

## 2015-10-02 MED ORDER — IOPAMIDOL (ISOVUE-300) INJECTION 61%
100.0000 mL | Freq: Once | INTRAVENOUS | Status: AC | PRN
  Administered 2015-10-02: 100 mL via INTRAVENOUS

## 2015-10-02 MED ORDER — SUCRALFATE 1 G PO TABS: 1.0000 g | ORAL_TABLET | Freq: Four times a day (QID) | ORAL | 1 refills | Status: DC | PRN

## 2015-10-02 MED ORDER — DIATRIZOATE MEGLUMINE & SODIUM 66-10 % PO SOLN
15.0000 mL | Freq: Once | ORAL | Status: AC
  Administered 2015-10-02: 15 mL via ORAL

## 2015-10-02 NOTE — ED Triage Notes (Signed)
Dark tarry stool , has taken Pepto recently prior to dark stool, epigastric pain

## 2015-10-02 NOTE — ED Provider Notes (Signed)
[facility] Emergency Department Provider Note  ____________________________________________   First MD Initiated Contact with Patient 10/02/15 [phone]     (approximate)  I have reviewed the triage vital signs and the nursing notes.   HISTORY  Chief Complaint Abdominal Pain    HPI Teresa Holt is a 81 y.o. female with a history of frequent episodes of abdominal pain and reportedly a history of an ulcer who presents for evaluation of epigastric abdominal pain.  She says that this is not anything new for her but that it is been worse over the course of the last day.  Specifically over the last couple of hours she went to the bathroom and felt a little bit weak and dizzy and had worsening of the abdominal pain which she rates as moderate and an aching pain in the epigastrium.  She has had some nausea but no recent vomiting.  She has had these symptoms multiple times in the past.  She was also concerned that she might have some blood in her stool or some dark stools but she also has taken Pepto-Bismol recently.  She denies fever/chills, chest pain.  She has had shortness of breath when her abdominal pain is worse but currently no shortness of breath.  She denies dysuria.The symptoms are intermittent, generally chronic, and occur acutely.   Past Medical History:  Diagnosis Date  . Anxiety   . CHF (congestive heart failure) (HCC)   . Coronary artery disease     There are no active problems to display for this patient.   Past Surgical History:  Procedure Laterality Date  . ABDOMINAL HYSTERECTOMY    . TONSILLECTOMY      Prior to Admission medications   Medication Sig Start Date End Date Taking? Authorizing Provider  sucralfate (CARAFATE) 1 g tablet Take 1 tablet (1 g total) by mouth 4 (four) times daily as needed (for abdominal discomfort, nausea, and/or vomiting). 10/02/15   [name], MD    Allergies Review of patient's allergies indicates no known  allergies.  No family history on file.  Social History Social History  Substance Use Topics  . Smoking status: Never Smoker  . Smokeless tobacco: Never Used  . Alcohol use Not on file    Review of Systems Constitutional: No fever/chills Eyes: No visual changes. ENT: No sore throat. Cardiovascular: Denies chest pain. Respiratory: Denies shortness of breath. Gastrointestinal: +abdominal pain.  nausea, no vomiting.  No diarrhea.  No constipation. Dark stools Genitourinary: Negative for dysuria. Musculoskeletal: Negative for back pain. Skin: Negative for rash. Neurological: Negative for headaches, focal weakness or numbness.  10-point ROS otherwise negative.  ____________________________________________   PHYSICAL EXAM:  VITAL SIGNS: ED Triage Vitals  Enc Vitals Group     BP 10/02/15 1809 119/68     Pulse Rate 10/02/15 1809 (!) 49     Resp 10/02/15 1809 17     Temp 10/02/15 1809 98.1 F (36.7 C)     Temp Source 10/02/15 1809 Oral     SpO2 10/02/15 1809 100 %     Weight 10/02/15 1809 180 lb (81.6 kg)     Height 10/02/15 1809  (1.6 m)     Head Circumference --      Peak Flow --      Pain Score 10/02/15 1815 6     Pain Loc --      Pain Edu? --      Excl. in GC? --     Constitutional: Alert and  oriented. No acute distress. Eyes: Conjunctivae are normal. PERRL. EOMI. Head: Atraumatic. Nose: No congestion/rhinnorhea. Mouth/Throat: Mucous membranes are moist.  Oropharynx non-erythematous. Neck: No stridor.  No meningeal signs.   Cardiovascular: Normal rate, regular rhythm. Good peripheral circulation. Grossly normal heart sounds.   Respiratory: Normal respiratory effort.  No retractions. Lungs CTAB. Gastrointestinal: Soft and nontender. No distention.  Rectal:  Normal external exam.  Dark stool that was heme-negative (quality control passed) Musculoskeletal: Chronic trace pitting edema bilaterally. No gross deformities of extremities. Neurologic:  Normal speech  and language. No gross focal neurologic deficits are appreciated.  Skin:  Skin is warm, dry and intact. No rash noted. Psychiatric: Mood and affect are normal. Speech and behavior are normal.  ____________________________________________   LABS (all labs ordered are listed, but only abnormal results are displayed)  Labs Reviewed  COMPREHENSIVE METABOLIC PANEL - Abnormal; Notable for the following:       Result Value   Glucose, Bld 105 (*)    BUN 22 (*)    ALT 11 (*)    Alkaline Phosphatase 36 (*)    All other components within normal limits  LIPASE, BLOOD  CBC  URINALYSIS COMPLETEWITH MICROSCOPIC ([facility] ONLY)   ____________________________________________  EKG  ED ECG REPORT I, Kenzly Rogoff, the attending physician, personally viewed and interpreted this ECG.  Date: 10/02/2015 EKG Time: 18:41 Rate: 45 Rhythm: Sinus bradycardia QRS Axis: normal Intervals: normal ST/T Wave abnormalities: normal Conduction Disturbances: none Narrative Interpretation: unremarkable except for asymptomatic bradycardia. A comparison EKG obtained in October 2015 was similarly bradycardic  ____________________________________________  RADIOLOGY   Ct Abdomen Pelvis W Contrast  Result Date: 10/02/2015 CLINICAL DATA:  Generalized abdominal pain with nausea and vomiting. Recent diarrhea, heme negative. History of diverticulitis. Patient reports epigastric pain for 1 week. EXAM: CT ABDOMEN AND PELVIS WITH CONTRAST TECHNIQUE: Multidetector CT imaging of the abdomen and pelvis was performed using the standard protocol following bolus administration of intravenous contrast. CONTRAST:  ISOVUE-300 IOPAMIDOL (ISOVUE-300) INJECTION 61% COMPARISON:  CT 05/19/2014 FINDINGS: Lower chest: The included lung bases are clear. No pleural effusion. Liver: Normal in size.  No focal lesion. Hepatobiliary: Gallbladder physiologically distended, no calcified stone. No biliary dilatation. Pancreas: Parenchymal  atrophy. No ductal dilatation or inflammation. Spleen: Normal. Adrenal glands: No nodule. Kidneys: Symmetric renal enhancement. No hydronephrosis. Symmetric excretion on delayed phase imaging. Small cortical hypodensities throughout the left kidney, largest in the lower pole measuring 1 cm, unchanged from prior exam. Tiny cortical hypodensity in the right kidney is also unchanged. Minimal left renal scarring. Stomach/Bowel: Stomach physiologically distended. Unchanged duodenal diverticulum without inflammation. There are no dilated or thickened small bowel loops. Distal ileal diverticulosis again seen. Small to moderate volume of stool throughout the colon without colonic wall thickening. Diverticulosis of the descending and sigmoid colon is rather extensive in degree. No acute inflammation or diverticulitis. The appendix is not visualized. Vascular/Lymphatic: No retroperitoneal adenopathy. Abdominal aorta is normal in caliber. Moderate atherosclerosis of the abdominal aorta and its branches without aneurysm. Reproductive: Uterus surgically absent.  No adnexal mass. Bladder: Physiologically distended without wall thickening. Other: No free air, free fluid, or intra-abdominal fluid collection. Musculoskeletal: There are no acute or suspicious osseous abnormalities. Degenerative disc disease at L4-L5 and L5-S1. Disc material causes canal narrowing at L4-L5. The bones are under mineralized. IMPRESSION: 1. No acute abnormality in the abdomen/pelvis. 2. Extensive distal colonic diverticulosis. Diverticulosis of the distal ileum also seen. No diverticulitis. Electronically Signed   By: Lujean Rave.D.  On: 10/02/2015 20:44    ____________________________________________   PROCEDURES  Procedure(s) performed:   Procedures   Critical Care performed: No ____________________________________________   INITIAL IMPRESSION / ASSESSMENT AND PLAN / ED COURSE  Pertinent labs & imaging results that were  available during my care of the patient were reviewed by me and considered in my medical decision making (see chart for details).  Generally well-appearing and in no acute distress.  Symptoms feel similar to her chronic  Clinical Course  Comment By Time  Patient's workup is unremarkable.  She has been bradycardic throughout her stay but she is asymptomatic from this.  Her EKG is reassuring other than the sinus bradycardia.  Her CT scan shows no acute findings.  I explained to her that her pain today is likely related to her chronic or at least frequent episodes of abdominal pain and that she is appropriate for outpatient follow-up.  I will provide her with a prescription for Carafate and recommend following up with her doctor at the next available opportunity.  She understands and agrees with the plan. [name], MD 08/03 2053    ____________________________________________  FINAL CLINICAL IMPRESSION(S) / ED DIAGNOSES  Final diagnoses:  Epigastric pain     MEDICATIONS GIVEN DURING THIS VISIT:  Medications  diatrizoate meglumine-sodium (GASTROGRAFIN) 66-10 % solution 15 mL (15 mLs Oral Given 10/02/15 1946)  iopamidol (ISOVUE-300) 61 % injection 100 mL (100 mLs Intravenous Contrast Given 10/02/15 2021)     NEW OUTPATIENT MEDICATIONS STARTED DURING THIS VISIT:  New Prescriptions   SUCRALFATE (CARAFATE) 1 G TABLET    Take 1 tablet (1 g total) by mouth 4 (four) times daily as needed (for abdominal discomfort, nausea, and/or vomiting).      Note:  This document was prepared using Dragon voice recognition software and may include unintentional dictation errors.    [name], MD 10/02/15 2104

## 2015-10-02 NOTE — Discharge Instructions (Signed)

## 2015-12-15 DIAGNOSIS — Z79899 Other long term (current) drug therapy: Secondary | ICD-10-CM | POA: Diagnosis not present

## 2015-12-15 DIAGNOSIS — E7800 Pure hypercholesterolemia, unspecified: Secondary | ICD-10-CM | POA: Diagnosis not present

## 2015-12-22 DIAGNOSIS — Z23 Encounter for immunization: Secondary | ICD-10-CM | POA: Diagnosis not present

## 2015-12-22 DIAGNOSIS — Z0000 Encounter for general adult medical examination without abnormal findings: Secondary | ICD-10-CM | POA: Diagnosis not present

## 2016-02-20 DIAGNOSIS — R9089 Other abnormal findings on diagnostic imaging of central nervous system: Secondary | ICD-10-CM | POA: Diagnosis not present

## 2016-02-20 DIAGNOSIS — H409 Unspecified glaucoma: Secondary | ICD-10-CM | POA: Diagnosis not present

## 2016-02-20 DIAGNOSIS — M25552 Pain in left hip: Secondary | ICD-10-CM | POA: Diagnosis not present

## 2016-02-20 DIAGNOSIS — R51 Headache: Secondary | ICD-10-CM | POA: Diagnosis not present

## 2016-02-20 DIAGNOSIS — R29810 Facial weakness: Secondary | ICD-10-CM | POA: Diagnosis not present

## 2016-02-20 DIAGNOSIS — R001 Bradycardia, unspecified: Secondary | ICD-10-CM | POA: Diagnosis not present

## 2016-02-20 DIAGNOSIS — I10 Essential (primary) hypertension: Secondary | ICD-10-CM | POA: Diagnosis not present

## 2016-02-20 DIAGNOSIS — G510 Bell's palsy: Secondary | ICD-10-CM | POA: Diagnosis not present

## 2016-02-20 DIAGNOSIS — I671 Cerebral aneurysm, nonruptured: Secondary | ICD-10-CM | POA: Diagnosis not present

## 2016-02-20 DIAGNOSIS — G319 Degenerative disease of nervous system, unspecified: Secondary | ICD-10-CM | POA: Diagnosis not present

## 2016-02-20 DIAGNOSIS — K219 Gastro-esophageal reflux disease without esophagitis: Secondary | ICD-10-CM | POA: Diagnosis not present

## 2016-02-20 DIAGNOSIS — I720 Aneurysm of carotid artery: Secondary | ICD-10-CM | POA: Diagnosis not present

## 2016-02-20 DIAGNOSIS — G8929 Other chronic pain: Secondary | ICD-10-CM | POA: Diagnosis not present

## 2016-02-20 DIAGNOSIS — I672 Cerebral atherosclerosis: Secondary | ICD-10-CM | POA: Diagnosis not present

## 2016-02-20 DIAGNOSIS — E7800 Pure hypercholesterolemia, unspecified: Secondary | ICD-10-CM | POA: Diagnosis not present

## 2016-03-02 DIAGNOSIS — I671 Cerebral aneurysm, nonruptured: Secondary | ICD-10-CM | POA: Diagnosis not present

## 2016-03-02 DIAGNOSIS — I10 Essential (primary) hypertension: Secondary | ICD-10-CM | POA: Diagnosis not present

## 2016-03-02 DIAGNOSIS — G510 Bell's palsy: Secondary | ICD-10-CM | POA: Diagnosis not present

## 2016-03-03 DIAGNOSIS — H401133 Primary open-angle glaucoma, bilateral, severe stage: Secondary | ICD-10-CM | POA: Diagnosis not present

## 2016-03-11 DIAGNOSIS — E785 Hyperlipidemia, unspecified: Secondary | ICD-10-CM | POA: Diagnosis not present

## 2016-03-11 DIAGNOSIS — H409 Unspecified glaucoma: Secondary | ICD-10-CM | POA: Diagnosis not present

## 2016-03-11 DIAGNOSIS — K219 Gastro-esophageal reflux disease without esophagitis: Secondary | ICD-10-CM | POA: Diagnosis not present

## 2016-03-11 DIAGNOSIS — Z8673 Personal history of transient ischemic attack (TIA), and cerebral infarction without residual deficits: Secondary | ICD-10-CM | POA: Diagnosis not present

## 2016-03-11 DIAGNOSIS — I10 Essential (primary) hypertension: Secondary | ICD-10-CM | POA: Diagnosis not present

## 2016-03-11 DIAGNOSIS — E7800 Pure hypercholesterolemia, unspecified: Secondary | ICD-10-CM | POA: Diagnosis not present

## 2016-03-11 DIAGNOSIS — G510 Bell's palsy: Secondary | ICD-10-CM | POA: Diagnosis not present

## 2016-03-11 DIAGNOSIS — Z79899 Other long term (current) drug therapy: Secondary | ICD-10-CM | POA: Diagnosis not present

## 2016-03-11 DIAGNOSIS — R001 Bradycardia, unspecified: Secondary | ICD-10-CM | POA: Diagnosis not present

## 2016-03-11 DIAGNOSIS — R51 Headache: Secondary | ICD-10-CM | POA: Diagnosis not present

## 2016-03-11 DIAGNOSIS — G319 Degenerative disease of nervous system, unspecified: Secondary | ICD-10-CM | POA: Diagnosis not present

## 2016-03-31 DIAGNOSIS — H4010X3 Unspecified open-angle glaucoma, severe stage: Secondary | ICD-10-CM | POA: Diagnosis not present

## 2016-04-06 DIAGNOSIS — H401132 Primary open-angle glaucoma, bilateral, moderate stage: Secondary | ICD-10-CM | POA: Diagnosis not present

## 2016-06-14 DIAGNOSIS — I771 Stricture of artery: Secondary | ICD-10-CM | POA: Diagnosis not present

## 2016-06-14 DIAGNOSIS — M25552 Pain in left hip: Secondary | ICD-10-CM | POA: Diagnosis not present

## 2016-06-14 DIAGNOSIS — E785 Hyperlipidemia, unspecified: Secondary | ICD-10-CM | POA: Diagnosis not present

## 2016-06-14 DIAGNOSIS — I110 Hypertensive heart disease with heart failure: Secondary | ICD-10-CM | POA: Diagnosis not present

## 2016-06-14 DIAGNOSIS — R05 Cough: Secondary | ICD-10-CM | POA: Diagnosis not present

## 2016-06-14 DIAGNOSIS — G8929 Other chronic pain: Secondary | ICD-10-CM | POA: Diagnosis not present

## 2016-06-14 DIAGNOSIS — Z79899 Other long term (current) drug therapy: Secondary | ICD-10-CM | POA: Diagnosis not present

## 2016-06-14 DIAGNOSIS — M47817 Spondylosis without myelopathy or radiculopathy, lumbosacral region: Secondary | ICD-10-CM | POA: Diagnosis not present

## 2016-06-14 DIAGNOSIS — K219 Gastro-esophageal reflux disease without esophagitis: Secondary | ICD-10-CM | POA: Diagnosis not present

## 2016-06-14 DIAGNOSIS — I509 Heart failure, unspecified: Secondary | ICD-10-CM | POA: Diagnosis not present

## 2016-06-14 DIAGNOSIS — M545 Low back pain: Secondary | ICD-10-CM | POA: Diagnosis not present

## 2016-06-14 DIAGNOSIS — E7800 Pure hypercholesterolemia, unspecified: Secondary | ICD-10-CM | POA: Diagnosis not present

## 2016-06-14 DIAGNOSIS — Z8673 Personal history of transient ischemic attack (TIA), and cerebral infarction without residual deficits: Secondary | ICD-10-CM | POA: Diagnosis not present

## 2016-06-14 DIAGNOSIS — M5136 Other intervertebral disc degeneration, lumbar region: Secondary | ICD-10-CM | POA: Diagnosis not present

## 2016-06-14 DIAGNOSIS — M5137 Other intervertebral disc degeneration, lumbosacral region: Secondary | ICD-10-CM | POA: Diagnosis not present

## 2016-06-21 DIAGNOSIS — Z79899 Other long term (current) drug therapy: Secondary | ICD-10-CM | POA: Diagnosis not present

## 2016-06-21 DIAGNOSIS — D649 Anemia, unspecified: Secondary | ICD-10-CM | POA: Diagnosis not present

## 2016-06-21 DIAGNOSIS — K219 Gastro-esophageal reflux disease without esophagitis: Secondary | ICD-10-CM | POA: Diagnosis not present

## 2016-06-21 DIAGNOSIS — I10 Essential (primary) hypertension: Secondary | ICD-10-CM | POA: Diagnosis not present

## 2016-06-21 DIAGNOSIS — E7800 Pure hypercholesterolemia, unspecified: Secondary | ICD-10-CM | POA: Diagnosis not present

## 2016-07-05 DIAGNOSIS — H401132 Primary open-angle glaucoma, bilateral, moderate stage: Secondary | ICD-10-CM | POA: Diagnosis not present

## 2016-11-17 ENCOUNTER — Emergency Department: Payer: Medicare HMO

## 2016-11-17 ENCOUNTER — Emergency Department
Admission: EM | Admit: 2016-11-17 | Discharge: 2016-11-17 | Disposition: A | Discharge status: Home / Self Care | Payer: Medicare HMO | Attending: Emergency Medicine | Admitting: Emergency Medicine

## 2016-11-17 DIAGNOSIS — R0602 Shortness of breath: Secondary | ICD-10-CM | POA: Diagnosis not present

## 2016-11-17 DIAGNOSIS — J019 Acute sinusitis, unspecified: Secondary | ICD-10-CM

## 2016-11-17 DIAGNOSIS — R0600 Dyspnea, unspecified: Secondary | ICD-10-CM | POA: Insufficient documentation

## 2016-11-17 DIAGNOSIS — R05 Cough: Secondary | ICD-10-CM | POA: Diagnosis not present

## 2016-11-17 DIAGNOSIS — I2510 Atherosclerotic heart disease of native coronary artery without angina pectoris: Secondary | ICD-10-CM | POA: Diagnosis not present

## 2016-11-17 DIAGNOSIS — I509 Heart failure, unspecified: Secondary | ICD-10-CM | POA: Diagnosis not present

## 2016-11-17 LAB — CBC WITH DIFFERENTIAL/PLATELET
Basophils Absolute: 0 10*3/uL (ref 0–0.1)
Basophils Relative: 1 %
EOS ABS: 0.2 10*3/uL (ref 0–0.7)
Eosinophils Relative: 4 %
HEMATOCRIT: 35.4 % (ref 35.0–47.0)
Hemoglobin: 12 g/dL (ref 12.0–16.0)
LYMPHS ABS: 1.6 10*3/uL (ref 1.0–3.6)
LYMPHS PCT: 41 %
MCH: 29.9 pg (ref 26.0–34.0)
MCHC: 33.9 g/dL (ref 32.0–36.0)
MCV: 88.3 fL (ref 80.0–100.0)
Monocytes Absolute: 0.5 10*3/uL (ref 0.2–0.9)
Monocytes Relative: 12 %
Neutro Abs: 1.7 10*3/uL (ref 1.4–6.5)
Neutrophils Relative %: 42 %
PLATELETS: 164 10*3/uL (ref 150–440)
RBC: 4.01 MIL/uL (ref 3.80–5.20)
RDW: 13.7 % (ref 11.5–14.5)
WBC: 4 10*3/uL (ref 3.6–11.0)

## 2016-11-17 LAB — BASIC METABOLIC PANEL
Anion gap: 8 (ref 5–15)
BUN: 20 mg/dL (ref 6–20)
CO2: 30 mmol/L (ref 22–32)
CREATININE: 0.96 mg/dL (ref 0.44–1.00)
Calcium: 9.1 mg/dL (ref 8.9–10.3)
Chloride: 101 mmol/L (ref 101–111)
GFR calc Af Amer: >60 mL/min (ref >60)
GFR, EST NON AFRICAN AMERICAN: 54 mL/min — AB (ref >60)
GLUCOSE: 100 mg/dL — AB (ref 65–99)
Potassium: 3.7 mmol/L (ref 3.5–5.1)
Sodium: 139 mmol/L (ref 135–145)

## 2016-11-17 LAB — BRAIN NATRIURETIC PEPTIDE: B Natriuretic Peptide: 242 pg/mL — ABNORMAL HIGH (ref 0.0–100.0)

## 2016-11-17 LAB — TROPONIN I: Troponin I: <0.03 ng/mL (ref <0.03)

## 2016-11-17 MED ORDER — AZITHROMYCIN 250 MG PO TABS: ORAL_TABLET | ORAL | 0 refills | Status: DC

## 2016-11-17 MED ORDER — AZITHROMYCIN 500 MG PO TABS
500.0000 mg | ORAL_TABLET | Freq: Once | ORAL | Status: AC
  Administered 2016-11-17: 500 mg via ORAL
  Filled 2016-11-17: qty 1

## 2016-11-17 MED ORDER — HYDROCOD POLST-CPM POLST ER 10-8 MG/5ML PO SUER: 5.0000 mL | Freq: Two times a day (BID) | ORAL | 0 refills | Status: DC

## 2016-11-17 MED ORDER — HYDROCOD POLST-CPM POLST ER 10-8 MG/5ML PO SUER
5.0000 mL | Freq: Once | ORAL | Status: AC
  Administered 2016-11-17: 5 mL via ORAL
  Filled 2016-11-17: qty 5

## 2016-11-17 NOTE — ED Triage Notes (Signed)
Pt c/o increased SOB with cough for the past week. Denies any pain at present.. Pt has a hx of CHF, pt denies any increased swelling of LE.Marland Kitchen

## 2016-11-17 NOTE — ED Provider Notes (Signed)
[facility] Emergency Department Provider Note       Time seen: ----------------------------------------- 8:23 AM on 11/17/2016 -----------------------------------------     I have reviewed the triage vital signs and the nursing notes.   HISTORY   Chief Complaint Shortness of Breath    HPI Teresa Holt is a 82 y.o. female who presents to the ED for increased shortness of breath and cough for the last week. Patient denies any pain, states it's worse when she lays down. She thinks her symptoms may be secondary to sinus problems. Patient states she feels sinus congestion and drainage down her throat. She is not thinks she has had a weight gain or increase in fluid.   Past Medical History:  Diagnosis Date  . Anxiety   . CHF (congestive heart failure) (HCC)   . Coronary artery disease     There are no active problems to display for this patient.   Past Surgical History:  Procedure Laterality Date  . ABDOMINAL HYSTERECTOMY    . TONSILLECTOMY      Allergies Patient has no known allergies.  Social History Social History  Substance Use Topics  . Smoking status: Never Smoker  . Smokeless tobacco: Never Used  . Alcohol use No    Review of Systems Constitutional: Negative for fever. Eyes: Negative for vision changes ENT:  Negative for congestion, sore throat. Positive for sinus congestion Cardiovascular: Negative for chest pain. Respiratory: positive for shortness of breath Gastrointestinal: Negative for abdominal pain, vomiting and diarrhea. Genitourinary: Negative for dysuria. Musculoskeletal: Negative for back pain. Skin: Negative for rash. Neurological: Negative for headaches, focal weakness or numbness.  All systems negative/normal/unremarkable except as stated in the HPI  ____________________________________________   PHYSICAL EXAM:  VITAL SIGNS: ED Triage Vitals  Enc Vitals Group     BP 11/17/16 0812 (!) 154/82     Pulse  Rate 11/17/16 0812 (!) 50     Resp 11/17/16 0812 18     Temp 11/17/16 0812 97.9 F (36.6 C)     Temp Source 11/17/16 0812 Oral     SpO2 11/17/16 0812 100 %     Weight 11/17/16 0814 180 lb (81.6 kg)     Height 11/17/16 0814  (1.6 m)     Head Circumference --      Peak Flow --      Pain Score 11/17/16 0814 0     Pain Loc --      Pain Edu? --      Excl. in GC? --     Constitutional: Alert and oriented. Well appearing and in no distress. Eyes: Conjunctivae are normal. Normal extraocular movements. ENT   Head: Normocephalic and atraumatic.   Nose: No congestion/rhinnorhea.   Mouth/Throat: Mucous membranes are moist.   Neck: No stridor. Cardiovascular: Normal rate, regular rhythm. No murmurs, rubs, or gallops. Respiratory: Normal respiratory effort without tachypnea nor retractions. Breath sounds are clear and equal bilaterally. No wheezes/rales/rhonchi. Gastrointestinal: Soft and nontender. Normal bowel sounds Musculoskeletal: Nontender with normal range of motion in extremities. No lower extremity tenderness nor edema. Neurologic:  Normal speech and language. No gross focal neurologic deficits are appreciated.  Skin:  Skin is warm, dry and intact. No rash noted. Psychiatric: Mood and affect are normal. Speech and behavior are normal.  ____________________________________________  EKG: Interpreted by me.sinus bradycardia with a rate of 46 bpm, premature atrial contraction, normal axis, normal QT  ____________________________________________  ED COURSE:  Pertinent labs & imaging results that were available during  my care of the patient were reviewed by me and considered in my medical decision making (see chart for details). Patient presents for shortness of breath and cough, we will assess with labs and imaging as indicated.   Procedures ____________________________________________   LABS (pertinent positives/negatives)  Labs Reviewed  BASIC METABOLIC PANEL -  Abnormal; Notable for the following:       Result Value   Glucose, Bld 100 (*)    GFR calc non Af Amer 54 (*)    All other components within normal limits  BRAIN NATRIURETIC PEPTIDE - Abnormal; Notable for the following:    B Natriuretic Peptide 242.0 (*)    All other components within normal limits  CBC WITH DIFFERENTIAL/PLATELET  TROPONIN I  labs are reassuring for likely chronic CHF with no acute findings  RADIOLOGY Images were viewed by me  chest x-ray IMPRESSION: 1. Low lung volumes, otherwise no acute cardiopulmonary abnormality. 2. Chronically tortuous aorta with calcified Atherosclerosis (ICD10-I70.0). no acute process identified on x-ray ____________________________________________  FINAL ASSESSMENT AND PLAN  dyspnea, sinusitis   Plan: Patient's labs and imaging were dictated above. Patient had presented for sinus pressure with drainage and likely symptoms secondary to same. I do not see any acute cardiac or pulmonary issue at this time. She'll be given decongestants as well as cough medications and a short course of antibiotic. She is stable for outpatient follow-up.   Emily Filbert, MD   Note: This note was generated in part or whole with voice recognition software. Voice recognition is usually quite accurate but there are transcription errors that can and very often do occur. I apologize for any typographical errors that were not detected and corrected.     Emily Filbert, MD 11/17/16 1000

## 2016-12-20 DIAGNOSIS — E7800 Pure hypercholesterolemia, unspecified: Secondary | ICD-10-CM | POA: Diagnosis not present

## 2016-12-20 DIAGNOSIS — Z79899 Other long term (current) drug therapy: Secondary | ICD-10-CM | POA: Diagnosis not present

## 2016-12-20 DIAGNOSIS — D649 Anemia, unspecified: Secondary | ICD-10-CM | POA: Diagnosis not present

## 2016-12-27 DIAGNOSIS — Z23 Encounter for immunization: Secondary | ICD-10-CM | POA: Diagnosis not present

## 2016-12-27 DIAGNOSIS — Z0000 Encounter for general adult medical examination without abnormal findings: Secondary | ICD-10-CM | POA: Diagnosis not present

## 2017-01-06 DIAGNOSIS — H401132 Primary open-angle glaucoma, bilateral, moderate stage: Secondary | ICD-10-CM | POA: Diagnosis not present

## 2017-03-03 DIAGNOSIS — H401132 Primary open-angle glaucoma, bilateral, moderate stage: Secondary | ICD-10-CM | POA: Diagnosis not present

## 2017-04-15 DIAGNOSIS — H401132 Primary open-angle glaucoma, bilateral, moderate stage: Secondary | ICD-10-CM | POA: Diagnosis not present

## 2017-05-03 DIAGNOSIS — H401132 Primary open-angle glaucoma, bilateral, moderate stage: Secondary | ICD-10-CM | POA: Diagnosis not present

## 2017-06-27 DIAGNOSIS — K219 Gastro-esophageal reflux disease without esophagitis: Secondary | ICD-10-CM | POA: Diagnosis not present

## 2017-06-27 DIAGNOSIS — I10 Essential (primary) hypertension: Secondary | ICD-10-CM | POA: Diagnosis not present

## 2017-06-27 DIAGNOSIS — J302 Other seasonal allergic rhinitis: Secondary | ICD-10-CM | POA: Diagnosis not present

## 2017-06-27 DIAGNOSIS — Z79899 Other long term (current) drug therapy: Secondary | ICD-10-CM | POA: Diagnosis not present

## 2017-06-27 DIAGNOSIS — E7800 Pure hypercholesterolemia, unspecified: Secondary | ICD-10-CM | POA: Diagnosis not present

## 2017-08-09 DIAGNOSIS — H401132 Primary open-angle glaucoma, bilateral, moderate stage: Secondary | ICD-10-CM | POA: Diagnosis not present

## 2017-08-19 DIAGNOSIS — E785 Hyperlipidemia, unspecified: Secondary | ICD-10-CM | POA: Diagnosis not present

## 2017-08-19 DIAGNOSIS — E7800 Pure hypercholesterolemia, unspecified: Secondary | ICD-10-CM | POA: Diagnosis not present

## 2017-08-19 DIAGNOSIS — Z8673 Personal history of transient ischemic attack (TIA), and cerebral infarction without residual deficits: Secondary | ICD-10-CM | POA: Diagnosis not present

## 2017-08-19 DIAGNOSIS — I110 Hypertensive heart disease with heart failure: Secondary | ICD-10-CM | POA: Diagnosis not present

## 2017-08-19 DIAGNOSIS — R109 Unspecified abdominal pain: Secondary | ICD-10-CM | POA: Diagnosis not present

## 2017-08-19 DIAGNOSIS — R1030 Lower abdominal pain, unspecified: Secondary | ICD-10-CM | POA: Diagnosis not present

## 2017-08-19 DIAGNOSIS — R112 Nausea with vomiting, unspecified: Secondary | ICD-10-CM | POA: Diagnosis not present

## 2017-08-19 DIAGNOSIS — R001 Bradycardia, unspecified: Secondary | ICD-10-CM | POA: Diagnosis not present

## 2017-08-19 DIAGNOSIS — R51 Headache: Secondary | ICD-10-CM | POA: Diagnosis not present

## 2017-08-19 DIAGNOSIS — I509 Heart failure, unspecified: Secondary | ICD-10-CM | POA: Diagnosis not present

## 2017-08-19 DIAGNOSIS — R197 Diarrhea, unspecified: Secondary | ICD-10-CM | POA: Diagnosis not present

## 2017-08-19 DIAGNOSIS — R1032 Left lower quadrant pain: Secondary | ICD-10-CM | POA: Diagnosis not present

## 2017-09-12 DIAGNOSIS — H53143 Visual discomfort, bilateral: Secondary | ICD-10-CM | POA: Diagnosis not present

## 2017-09-12 DIAGNOSIS — R42 Dizziness and giddiness: Secondary | ICD-10-CM | POA: Diagnosis not present

## 2017-09-12 DIAGNOSIS — Z79899 Other long term (current) drug therapy: Secondary | ICD-10-CM | POA: Diagnosis not present

## 2017-09-12 DIAGNOSIS — R0600 Dyspnea, unspecified: Secondary | ICD-10-CM | POA: Diagnosis not present

## 2017-09-12 DIAGNOSIS — R200 Anesthesia of skin: Secondary | ICD-10-CM | POA: Diagnosis not present

## 2017-09-12 DIAGNOSIS — H53149 Visual discomfort, unspecified: Secondary | ICD-10-CM | POA: Diagnosis not present

## 2017-09-12 DIAGNOSIS — R51 Headache: Secondary | ICD-10-CM | POA: Diagnosis not present

## 2017-09-12 DIAGNOSIS — I499 Cardiac arrhythmia, unspecified: Secondary | ICD-10-CM | POA: Diagnosis not present

## 2017-09-12 DIAGNOSIS — R079 Chest pain, unspecified: Secondary | ICD-10-CM | POA: Diagnosis not present

## 2017-09-12 DIAGNOSIS — R110 Nausea: Secondary | ICD-10-CM | POA: Diagnosis not present

## 2017-09-12 DIAGNOSIS — Z8679 Personal history of other diseases of the circulatory system: Secondary | ICD-10-CM | POA: Diagnosis not present

## 2017-09-12 DIAGNOSIS — G3101 Pick's disease: Secondary | ICD-10-CM | POA: Diagnosis not present

## 2017-09-14 IMAGING — CT CT ABD-PELV W/ CM
2 of 5 series · 15 of 46 positions shown, 17 images · IV contrast (APPLIED)
Comparison: CT 05/19/2014

CLINICAL DATA: Generalized abdominal pain with nausea and vomiting.
Recent diarrhea, heme negative. History of diverticulitis. Patient
reports epigastric pain for 1 week.

EXAM:
CT ABDOMEN AND PELVIS WITH CONTRAST
TECHNIQUE: Multidetector CT imaging of the abdomen and pelvis was performed
using the standard protocol following bolus administration of
intravenous contrast.
CONTRAST:  100mL JTOLYL-8CC IOPAMIDOL (JTOLYL-8CC) INJECTION 61%

[Series 2: axial st · axial · 0.92mm/px · z∈[-609,-164]mm · 12 of 99 slices shown, 14 images]
[im 5/99  soft-tissue]
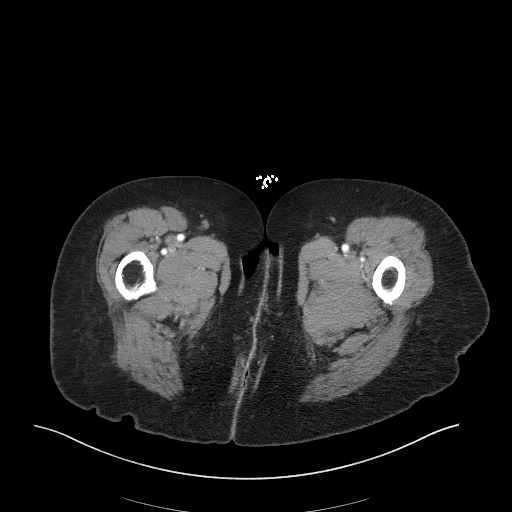
[im 5/99  bone]
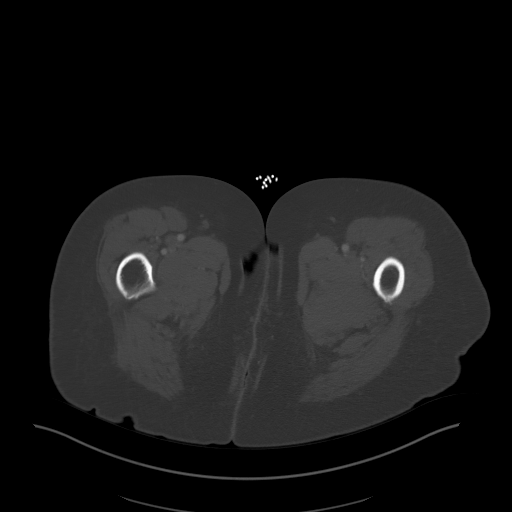
[im 15/99  soft-tissue]
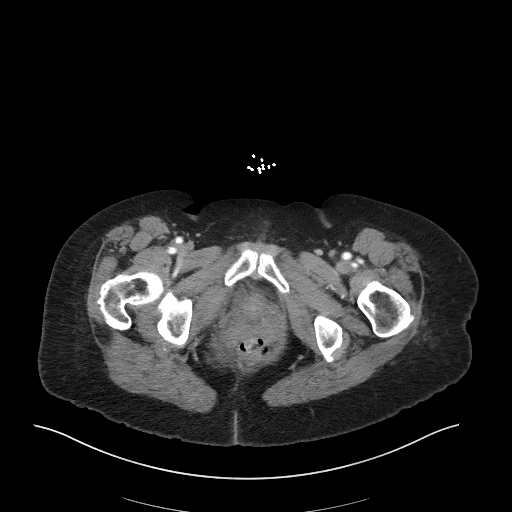
[im 24/99  soft-tissue]
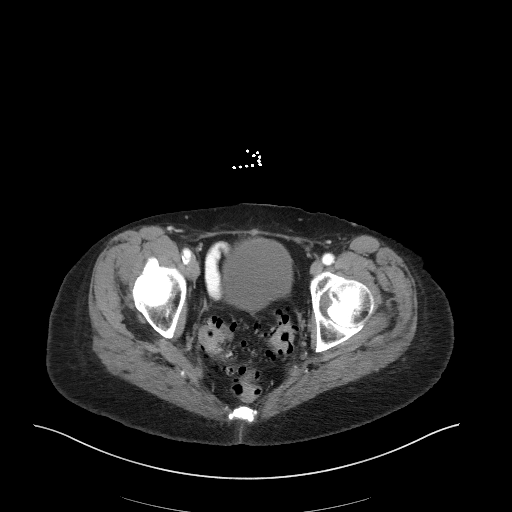
[im 29/99  soft-tissue]
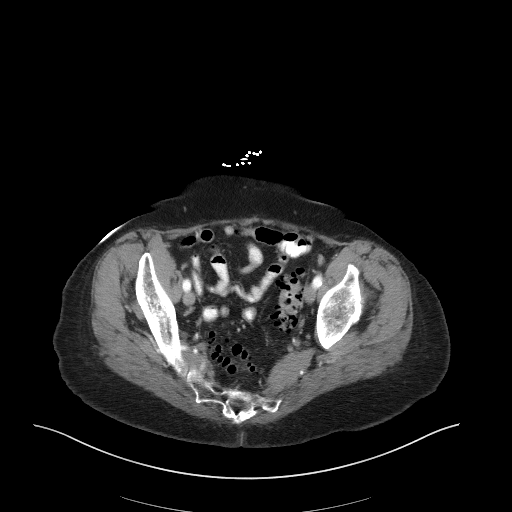
[im 38/99  soft-tissue]
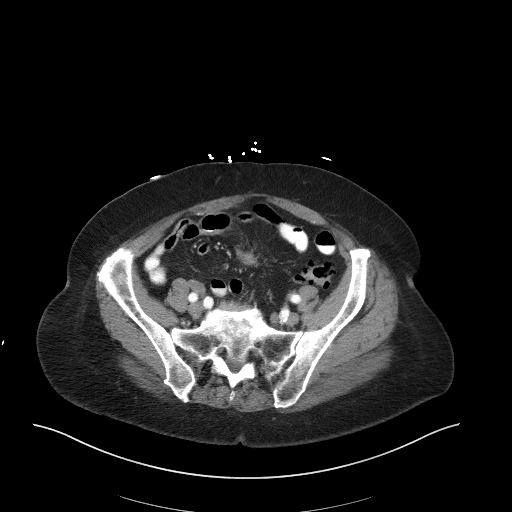
[im 47/99  soft-tissue]
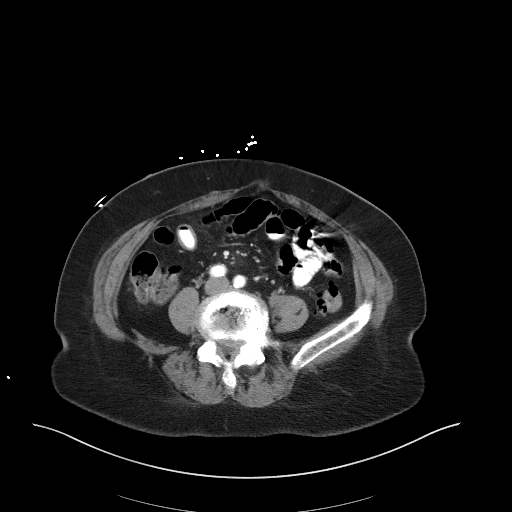
[im 52/99  soft-tissue]
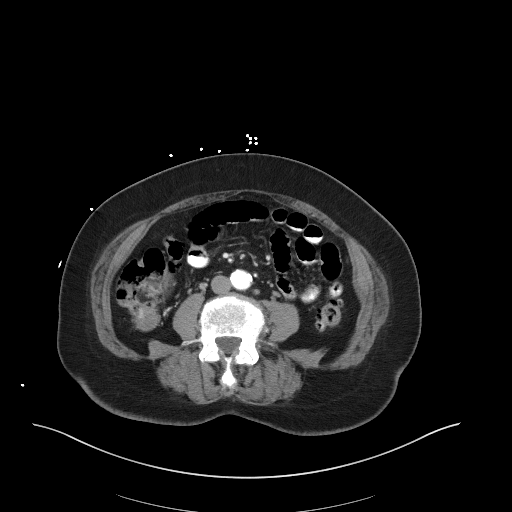
[im 61/99  soft-tissue]
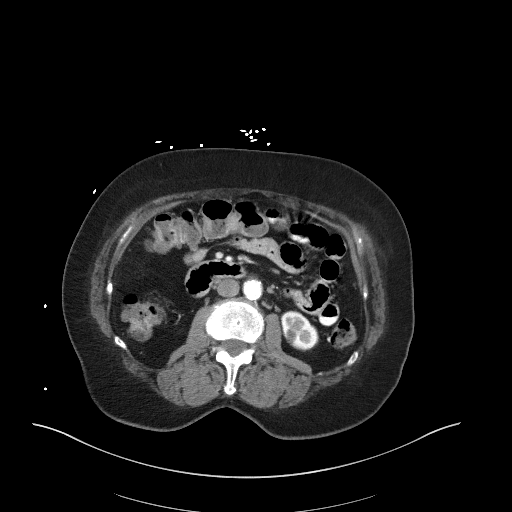
[im 71/99  soft-tissue]
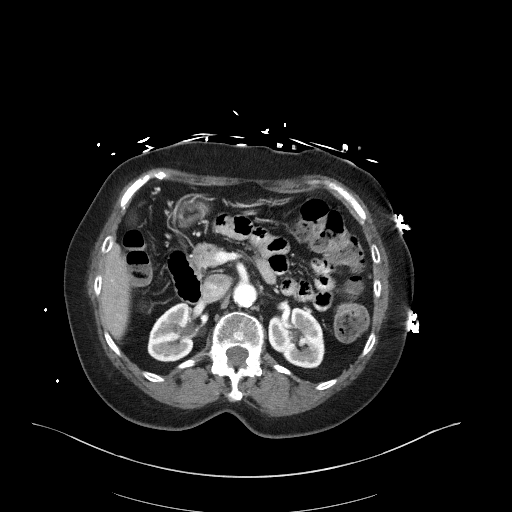
[im 71/99  bone]
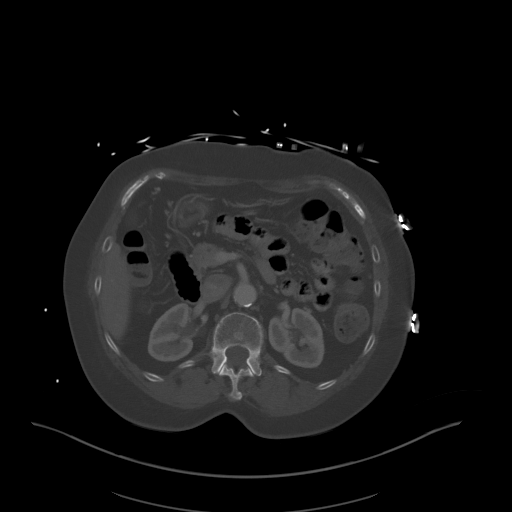
[im 75/99  soft-tissue]
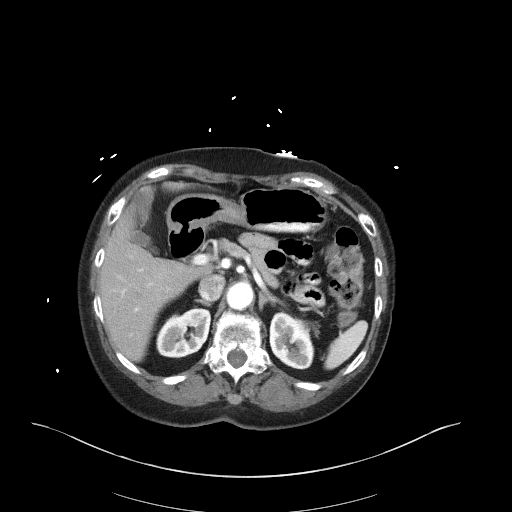
[im 85/99  soft-tissue]
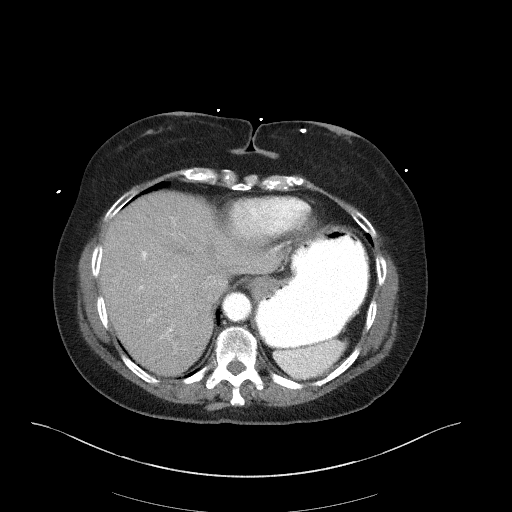
[im 94/99  soft-tissue]
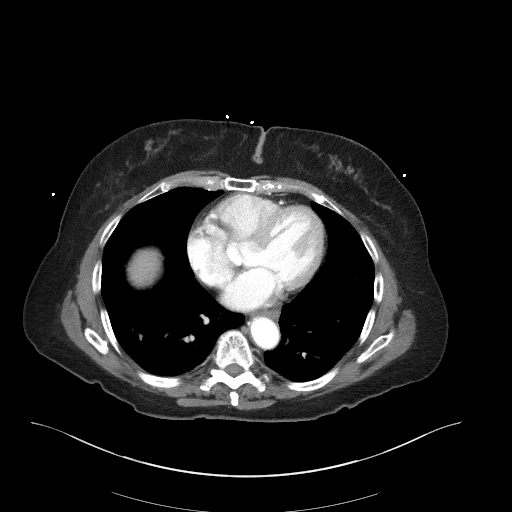

[Series 5: coronal st · coronal · 0.68mm/px · 3 of 83 slices shown]
[im 28/83  soft-tissue]
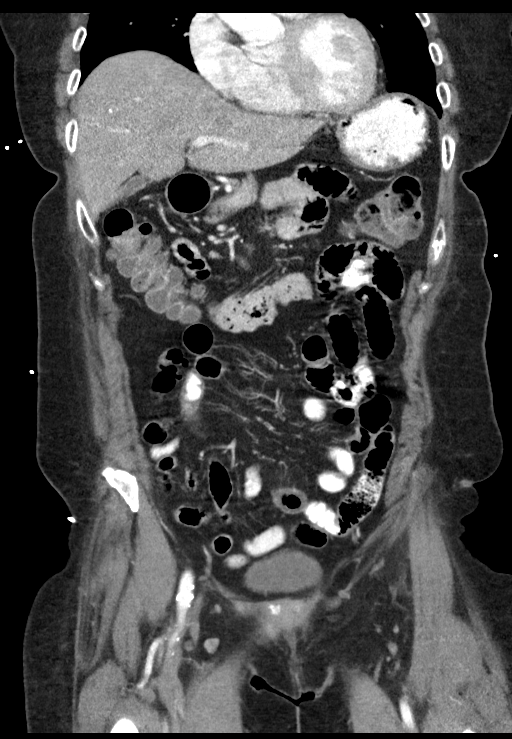
[im 37/83  soft-tissue]
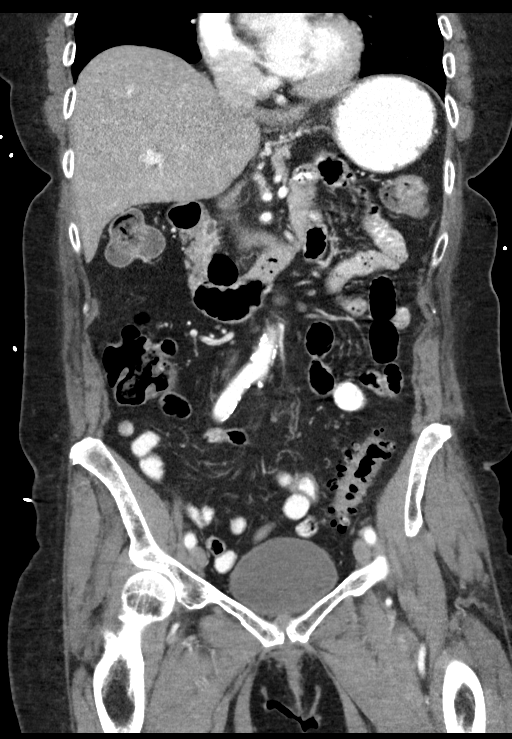
[im 46/83  soft-tissue]
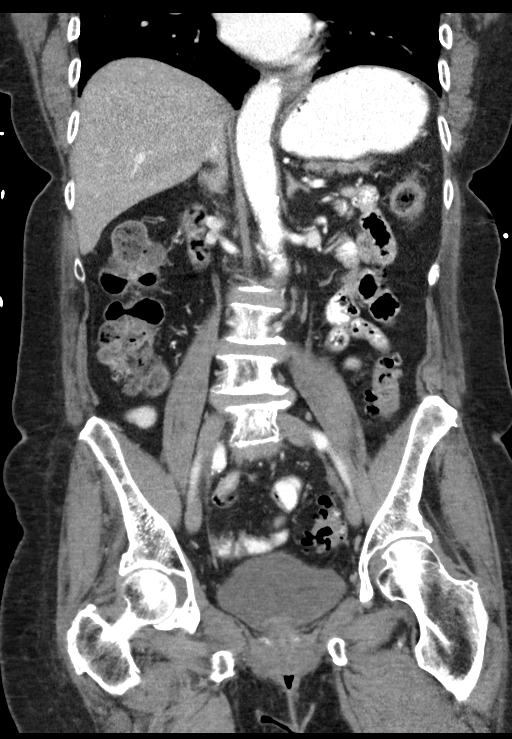

[15 of 46 positions shown; findings below may reference images not displayed]

FINDINGS: Lower chest: The included lung bases are clear. No pleural effusion.

Liver: Normal in size.  No focal lesion.

Hepatobiliary: Gallbladder physiologically distended, no calcified
stone. No biliary dilatation.

Pancreas: Parenchymal atrophy. No ductal dilatation or inflammation.

Spleen: Normal.

Adrenal glands: No nodule.

Kidneys: Symmetric renal enhancement. No hydronephrosis. Symmetric
excretion on delayed phase imaging. Small cortical hypodensities
throughout the left kidney, largest in the lower pole measuring 1
cm, unchanged from prior exam. Tiny cortical hypodensity in the
right kidney is also unchanged. Minimal left renal scarring.

Stomach/Bowel: Stomach physiologically distended. Unchanged duodenal
diverticulum without inflammation. There are no dilated or thickened
small bowel loops. Distal ileal diverticulosis again seen. Small to
moderate volume of stool throughout the colon without colonic wall
thickening. Diverticulosis of the descending and sigmoid colon is
rather extensive in degree. No acute inflammation or diverticulitis.
The appendix is not visualized.

Vascular/Lymphatic: No retroperitoneal adenopathy. Abdominal aorta
is normal in caliber. Moderate atherosclerosis of the abdominal
aorta and its branches without aneurysm.

Reproductive: Uterus surgically absent.  No adnexal mass.

Bladder: Physiologically distended without wall thickening.

Other: No free air, free fluid, or intra-abdominal fluid collection.

Musculoskeletal: There are no acute or suspicious osseous
abnormalities. Degenerative disc disease at L4-L5 and L5-S1. Disc
material causes canal narrowing at L4-L5. The bones are under
mineralized.
IMPRESSION: 1. No acute abnormality in the abdomen/pelvis.
2. Extensive distal colonic diverticulosis. Diverticulosis of the
distal ileum also seen. No diverticulitis.

## 2017-11-07 DIAGNOSIS — H401132 Primary open-angle glaucoma, bilateral, moderate stage: Secondary | ICD-10-CM | POA: Diagnosis not present

## 2017-11-14 DIAGNOSIS — E876 Hypokalemia: Secondary | ICD-10-CM | POA: Diagnosis not present

## 2017-11-14 DIAGNOSIS — R195 Other fecal abnormalities: Secondary | ICD-10-CM | POA: Diagnosis not present

## 2017-11-14 DIAGNOSIS — E7800 Pure hypercholesterolemia, unspecified: Secondary | ICD-10-CM | POA: Diagnosis not present

## 2017-11-14 DIAGNOSIS — K529 Noninfective gastroenteritis and colitis, unspecified: Secondary | ICD-10-CM | POA: Diagnosis not present

## 2017-11-14 DIAGNOSIS — R197 Diarrhea, unspecified: Secondary | ICD-10-CM | POA: Diagnosis not present

## 2017-11-14 DIAGNOSIS — R933 Abnormal findings on diagnostic imaging of other parts of digestive tract: Secondary | ICD-10-CM | POA: Diagnosis not present

## 2017-11-14 DIAGNOSIS — Z8673 Personal history of transient ischemic attack (TIA), and cerebral infarction without residual deficits: Secondary | ICD-10-CM | POA: Diagnosis not present

## 2017-11-14 DIAGNOSIS — K921 Melena: Secondary | ICD-10-CM | POA: Diagnosis not present

## 2017-11-14 DIAGNOSIS — R109 Unspecified abdominal pain: Secondary | ICD-10-CM | POA: Diagnosis not present

## 2017-11-14 DIAGNOSIS — R1084 Generalized abdominal pain: Secondary | ICD-10-CM | POA: Diagnosis not present

## 2017-11-14 DIAGNOSIS — K5793 Diverticulitis of intestine, part unspecified, without perforation or abscess with bleeding: Secondary | ICD-10-CM | POA: Diagnosis not present

## 2017-11-14 DIAGNOSIS — K219 Gastro-esophageal reflux disease without esophagitis: Secondary | ICD-10-CM | POA: Diagnosis not present

## 2017-11-14 DIAGNOSIS — R110 Nausea: Secondary | ICD-10-CM | POA: Diagnosis not present

## 2017-11-14 DIAGNOSIS — R935 Abnormal findings on diagnostic imaging of other abdominal regions, including retroperitoneum: Secondary | ICD-10-CM | POA: Diagnosis not present

## 2017-11-14 DIAGNOSIS — Z791 Long term (current) use of non-steroidal anti-inflammatories (NSAID): Secondary | ICD-10-CM | POA: Diagnosis not present

## 2017-11-14 DIAGNOSIS — M1990 Unspecified osteoarthritis, unspecified site: Secondary | ICD-10-CM | POA: Diagnosis not present

## 2017-11-14 DIAGNOSIS — I10 Essential (primary) hypertension: Secondary | ICD-10-CM | POA: Diagnosis not present

## 2017-11-14 DIAGNOSIS — Z8669 Personal history of other diseases of the nervous system and sense organs: Secondary | ICD-10-CM | POA: Diagnosis not present

## 2017-11-14 DIAGNOSIS — M25512 Pain in left shoulder: Secondary | ICD-10-CM | POA: Diagnosis not present

## 2017-11-14 DIAGNOSIS — K6289 Other specified diseases of anus and rectum: Secondary | ICD-10-CM | POA: Diagnosis not present

## 2017-11-19 DIAGNOSIS — I10 Essential (primary) hypertension: Secondary | ICD-10-CM | POA: Diagnosis not present

## 2017-11-19 DIAGNOSIS — K5790 Diverticulosis of intestine, part unspecified, without perforation or abscess without bleeding: Secondary | ICD-10-CM | POA: Diagnosis not present

## 2017-11-19 DIAGNOSIS — K529 Noninfective gastroenteritis and colitis, unspecified: Secondary | ICD-10-CM | POA: Diagnosis not present

## 2017-11-19 DIAGNOSIS — G510 Bell's palsy: Secondary | ICD-10-CM | POA: Diagnosis not present

## 2017-11-19 DIAGNOSIS — H409 Unspecified glaucoma: Secondary | ICD-10-CM | POA: Diagnosis not present

## 2017-11-19 DIAGNOSIS — M1710 Unilateral primary osteoarthritis, unspecified knee: Secondary | ICD-10-CM | POA: Diagnosis not present

## 2017-11-21 DIAGNOSIS — Z79899 Other long term (current) drug therapy: Secondary | ICD-10-CM | POA: Diagnosis not present

## 2017-11-21 DIAGNOSIS — K529 Noninfective gastroenteritis and colitis, unspecified: Secondary | ICD-10-CM | POA: Diagnosis not present

## 2017-11-21 DIAGNOSIS — K921 Melena: Secondary | ICD-10-CM | POA: Diagnosis not present

## 2017-11-29 DIAGNOSIS — G510 Bell's palsy: Secondary | ICD-10-CM | POA: Diagnosis not present

## 2017-11-29 DIAGNOSIS — I10 Essential (primary) hypertension: Secondary | ICD-10-CM | POA: Diagnosis not present

## 2017-11-29 DIAGNOSIS — H409 Unspecified glaucoma: Secondary | ICD-10-CM | POA: Diagnosis not present

## 2017-11-29 DIAGNOSIS — K529 Noninfective gastroenteritis and colitis, unspecified: Secondary | ICD-10-CM | POA: Diagnosis not present

## 2017-11-29 DIAGNOSIS — M1710 Unilateral primary osteoarthritis, unspecified knee: Secondary | ICD-10-CM | POA: Diagnosis not present

## 2017-11-29 DIAGNOSIS — K5790 Diverticulosis of intestine, part unspecified, without perforation or abscess without bleeding: Secondary | ICD-10-CM | POA: Diagnosis not present

## 2017-12-20 DIAGNOSIS — H401132 Primary open-angle glaucoma, bilateral, moderate stage: Secondary | ICD-10-CM | POA: Diagnosis not present

## 2017-12-26 DIAGNOSIS — K640 First degree hemorrhoids: Secondary | ICD-10-CM | POA: Diagnosis not present

## 2017-12-26 DIAGNOSIS — K5730 Diverticulosis of large intestine without perforation or abscess without bleeding: Secondary | ICD-10-CM | POA: Diagnosis not present

## 2017-12-26 DIAGNOSIS — K6389 Other specified diseases of intestine: Secondary | ICD-10-CM | POA: Diagnosis not present

## 2017-12-26 DIAGNOSIS — K921 Melena: Secondary | ICD-10-CM | POA: Diagnosis not present

## 2018-01-05 DIAGNOSIS — Z0000 Encounter for general adult medical examination without abnormal findings: Secondary | ICD-10-CM | POA: Diagnosis not present

## 2018-03-02 ENCOUNTER — Encounter: Payer: Self-pay | Admitting: *Deleted

## 2018-03-02 ENCOUNTER — Other Ambulatory Visit: Payer: Self-pay

## 2018-03-02 ENCOUNTER — Emergency Department: Payer: Medicare HMO

## 2018-03-02 ENCOUNTER — Emergency Department
Admission: EM | Admit: 2018-03-02 | Discharge: 2018-03-02 | Disposition: A | Discharge status: Home / Self Care | Payer: Medicare HMO | Attending: Emergency Medicine | Admitting: Emergency Medicine

## 2018-03-02 DIAGNOSIS — R0602 Shortness of breath: Secondary | ICD-10-CM | POA: Diagnosis not present

## 2018-03-02 DIAGNOSIS — Z79899 Other long term (current) drug therapy: Secondary | ICD-10-CM | POA: Diagnosis not present

## 2018-03-02 DIAGNOSIS — I2510 Atherosclerotic heart disease of native coronary artery without angina pectoris: Secondary | ICD-10-CM | POA: Diagnosis not present

## 2018-03-02 DIAGNOSIS — R0982 Postnasal drip: Secondary | ICD-10-CM | POA: Diagnosis not present

## 2018-03-02 DIAGNOSIS — I509 Heart failure, unspecified: Secondary | ICD-10-CM | POA: Insufficient documentation

## 2018-03-02 DIAGNOSIS — J309 Allergic rhinitis, unspecified: Secondary | ICD-10-CM | POA: Insufficient documentation

## 2018-03-02 DIAGNOSIS — R05 Cough: Secondary | ICD-10-CM | POA: Insufficient documentation

## 2018-03-02 DIAGNOSIS — R059 Cough, unspecified: Secondary | ICD-10-CM

## 2018-03-02 LAB — COMPREHENSIVE METABOLIC PANEL
ALT: 15 U/L (ref 0–44)
AST: 20 U/L (ref 15–41)
Albumin: 4.6 g/dL (ref 3.5–5.0)
Alkaline Phosphatase: 52 U/L (ref 38–126)
Anion gap: 8 (ref 5–15)
BUN: 19 mg/dL (ref 8–23)
CO2: 29 mmol/L (ref 22–32)
Calcium: 9.1 mg/dL (ref 8.9–10.3)
Chloride: 100 mmol/L (ref 98–111)
Creatinine, Ser: 0.8 mg/dL (ref 0.44–1.00)
Glucose, Bld: 115 mg/dL — ABNORMAL HIGH (ref 70–99)
Potassium: 3.9 mmol/L (ref 3.5–5.1)
Sodium: 137 mmol/L (ref 135–145)
Total Bilirubin: 0.4 mg/dL (ref 0.3–1.2)
Total Protein: 7.7 g/dL (ref 6.5–8.1)

## 2018-03-02 LAB — TROPONIN I: Troponin I: <0.03 ng/mL (ref <0.03)

## 2018-03-02 LAB — CBC WITH DIFFERENTIAL/PLATELET
Abs Immature Granulocytes: 0.01 10*3/uL (ref 0.00–0.07)
Basophils Absolute: 0 10*3/uL (ref 0.0–0.1)
Basophils Relative: 0 %
Eosinophils Absolute: 0.1 10*3/uL (ref 0.0–0.5)
Eosinophils Relative: 2 %
HCT: 41.1 % (ref 36.0–46.0)
Hemoglobin: 12.8 g/dL (ref 12.0–15.0)
IMMATURE GRANULOCYTES: 0 %
Lymphocytes Relative: 39 %
Lymphs Abs: 1.9 10*3/uL (ref 0.7–4.0)
MCH: 28.7 pg (ref 26.0–34.0)
MCHC: 31.1 g/dL (ref 30.0–36.0)
MCV: 92.2 fL (ref 80.0–100.0)
Monocytes Absolute: 0.4 10*3/uL (ref 0.1–1.0)
Monocytes Relative: 8 %
NEUTROS PCT: 51 %
NRBC: 0 % (ref 0.0–0.2)
Neutro Abs: 2.5 10*3/uL (ref 1.7–7.7)
Platelets: 222 10*3/uL (ref 150–400)
RBC: 4.46 MIL/uL (ref 3.87–5.11)
RDW: 13.1 % (ref 11.5–15.5)
WBC: 4.8 10*3/uL (ref 4.0–10.5)

## 2018-03-02 LAB — BRAIN NATRIURETIC PEPTIDE: B Natriuretic Peptide: 43 pg/mL (ref 0.0–100.0)

## 2018-03-02 MED ORDER — FLUTICASONE PROPIONATE 50 MCG/ACT NA SUSP: 1.0000 | Freq: Two times a day (BID) | NASAL | 0 refills | Status: AC

## 2018-03-02 MED ORDER — CETIRIZINE HCL 10 MG PO TABS: 10.0000 mg | ORAL_TABLET | Freq: Every day | ORAL | 0 refills | Status: DC

## 2018-03-02 MED ORDER — BENZONATATE 100 MG PO CAPS: 100.0000 mg | ORAL_CAPSULE | Freq: Every evening | ORAL | 0 refills | Status: AC | PRN

## 2018-03-02 NOTE — ED Triage Notes (Signed)
Pt to ED reporting a productive cough x 2-3 months with white mucus. Pt reports she was seen 2 months ago and dx with a "dry cough" but pt has not seen improvement. No fevers.   Intermittent SOB during and after coughing episodes. No oxygen use at home. Oxygen saturation in triage is 100%

## 2018-03-02 NOTE — ED Notes (Signed)
Pt verbalized understanding of d/c instructions and f/u care. No further questions at this time. Pt ambulatory to the exit with steady gait.  

## 2018-03-02 NOTE — ED Provider Notes (Signed)
[facility] Emergency Department Provider Note  ____________________________________________  Time seen: Approximately 6:17 PM  I have reviewed the triage vital signs and the nursing notes.   HISTORY  Chief Complaint Cough and Shortness of Breath    HPI Teresa Holt is a 84 y.o. female who presents emergency department complaining of cough, shortness of breath x2-1/2 months.  Patient reports that she was originally told she had a "dry cough" several months ago and that it would "go away."  Patient reports that symptoms have not gone away.  She reports that she is coughing every day.  She does not have any fever, or other URI symptoms at this time.  Patient does state that she has allergies, is frequently sneezing and congested.  She does not take any medication for this complaint on a daily basis.  Patient does have a history of congestive heart failure and states that sometimes she feels like she is "drowning" with her shortness of breath.  Patient denies checking her weights on a daily basis.  She denies any significant peripheral edema other than what is normal at baseline.  Patient reports sometimes she has chest tightness with coughing but denies any frank chest pain.  Patient denies headache, visual changes, neck pain or stiffness, abdominal pain, nausea or vomiting, diarrhea or constipation.    Past Medical History:  Diagnosis Date  . Anxiety   . CHF (congestive heart failure) (HCC)   . Coronary artery disease     There are no active problems to display for this patient.   Past Surgical History:  Procedure Laterality Date  . ABDOMINAL HYSTERECTOMY    . TONSILLECTOMY      Prior to Admission medications   Medication Sig Start Date End Date Taking? Authorizing Provider  acetaminophen (TYLENOL) 500 MG tablet Take 500 mg by mouth every 6 (six) hours as needed.    [provider]  aspirin EC 81 MG tablet Take 81 mg by mouth daily.    [provider]  azithromycin (ZITHROMAX Z-PAK) 250 MG tablet take 1 tablet by mouth daily starting tomorrow 11/17/16   [name],  E, MD  benzonatate (TESSALON PERLES) 100 MG capsule Take 1 capsule (100 mg total) by mouth at bedtime as needed for cough. 03/02/18 03/02/19  , [name], PA-C  cetirizine (ZYRTEC) 10 MG tablet Take 1 tablet (10 mg total) by mouth daily. 03/02/18   , [name], PA-C  chlorpheniramine-HYDROcodone (TUSSIONEX PENNKINETIC ER) 10-8 MG/5ML SUER Take 5 mLs by mouth 2 (two) times daily. 11/17/16   [name],  E, MD  cloNIDine (CATAPRES) 0.1 MG tablet Take 0.1 mg by mouth 3 (three) times daily. 09/16/16   [provider]  cyanocobalamin ([employer] VITAMIN B12) 2000 MCG tablet Take 2,000 mcg by mouth daily.    [provider]  fluticasone (FLONASE) 50 MCG/ACT nasal spray Place 1 spray into both nostrils 2 (two) times daily. 03/02/18   , [name], PA-C  hydrochlorothiazide (HYDRODIURIL) 12.5 MG tablet Take 12.5 mg by mouth daily. 09/16/16   [provider]  ibuprofen (ADVIL,MOTRIN) 200 MG tablet Take 200 mg by mouth every 6 (six) hours as needed.    [provider]  latanoprost (XALATAN) 0.005 % ophthalmic solution Place 1 drop into both eyes at bedtime. 09/16/16   [provider]  lovastatin (MEVACOR) 20 MG tablet Take 20 mg by mouth at bedtime. 09/16/16   [provider]  metoprolol succinate (TOPROL-XL) 50 MG 24 hr tablet Take 25 mg  by mouth daily. 09/16/16   [provider]  omeprazole (PRILOSEC) 20 MG capsule Take 20 mg by mouth daily. 09/16/16   [provider]  sucralfate (CARAFATE) 1 g tablet Take 1 tablet (1 g total) by mouth 4 (four) times daily as needed (for abdominal discomfort, nausea, and/or vomiting). Patient not taking: Reported on 11/17/2016 10/02/15   Teresa Rose, MD    Allergies Patient has no known allergies.  History reviewed. No pertinent family  history.  Social History Social History   Tobacco Use  . Smoking status: Never Smoker  . Smokeless tobacco: Never Used  Substance Use Topics  . Alcohol use: No  . Drug use: No     Review of Systems  Constitutional: No fever/chills Eyes: No visual changes. No discharge ENT: Positive for chronic nasal congestion and sneezing. Cardiovascular: no chest pain. Respiratory: Ongoing nonproductive cough.  Intermittent associated SOB. Gastrointestinal: No abdominal pain.  No nausea, no vomiting.  No diarrhea.  No constipation. Musculoskeletal: Negative for musculoskeletal pain. Skin: Negative for rash, abrasions, lacerations, ecchymosis. Neurological: Negative for headaches, focal weakness or numbness. 10-point ROS otherwise negative.  ____________________________________________   PHYSICAL EXAM:  VITAL SIGNS: ED Triage Vitals  Enc Vitals Group     BP 03/02/18 1643 (!) 154/106     Pulse Rate 03/02/18 1643 73     Resp --      Temp 03/02/18 1643 97.8 F (36.6 C)     Temp Source 03/02/18 1643 Oral     SpO2 03/02/18 1643 100 %     Weight 03/02/18 1644 180 lb (81.6 kg)     Height 03/02/18 1644 5\' 3"  (1.6 m)     Head Circumference --      Peak Flow --      Pain Score 03/02/18 1706 3     Pain Loc --      Pain Edu? --      Excl. in GC? --      Constitutional: Alert and oriented. Well appearing and in no acute distress. Eyes: Conjunctivae are normal. PERRL. EOMI. Head: Atraumatic. ENT:      Ears: EACs unremarkable bilaterally.  TMs are mildly to moderately bulging bilaterally.  No injection or air-fluid level.      Nose: Moderate clear congestion/rhinnorhea.  Turbinates are boggy.      Mouth/Throat: Mucous membranes are moist.  Postnasal drip identified in the posterior oropharynx.  No erythema or edema.  Uvula is midline. Neck: No stridor.  Neck is supple full range of motion Hematological/Lymphatic/Immunilogical: No cervical lymphadenopathy. Cardiovascular: Normal rate,  regular rhythm. Normal S1 and S2.  No murmurs, rubs, gallops.  Good peripheral circulation.  2+ edema bilateral lower extremity.  Nonpitting.  Patient reports this is baseline. Respiratory: Normal respiratory effort without tachypnea or retractions. Lungs CTAB. Good air entry to the bases with no decreased or absent breath sounds. Musculoskeletal: Full range of motion to all extremities. No gross deformities appreciated. Neurologic:  Normal speech and language. No gross focal neurologic deficits are appreciated.  Skin:  Skin is warm, dry and intact. No rash noted. Psychiatric: Mood and affect are normal. Speech and behavior are normal. Patient exhibits appropriate insight and judgement.   ____________________________________________   LABS (all labs ordered are listed, but only abnormal results are displayed)  Labs Reviewed  COMPREHENSIVE METABOLIC PANEL - Abnormal; Notable for the following components:      Result Value   Glucose, Bld 115 (*)    All other components within normal limits  CBC WITH DIFFERENTIAL/PLATELET  BRAIN NATRIURETIC PEPTIDE  TROPONIN I   ____________________________________________  EKG  ED ECG REPORT I, Delorise Royals ,  personally viewed and interpreted this ECG.   Date: 03/02/2018  EKG Time: 1648 hrs.  Rate: 72 bpm  Rhythm: normal EKG, normal sinus rhythm, unchanged from previous tracings  Axis: Normal axis  Intervals:none  ST&T Change: No ST elevation or depression noted.  Normal EKG.  No STEMI.  ____________________________________________  RADIOLOGY I personally viewed and evaluated these images as part of my medical decision making, as well as reviewing the written report by the radiologist.  Dg Chest 2 View  Result Date: 03/02/2018 CLINICAL DATA:  Productive cough for 2-3 months EXAM: CHEST - 2 VIEW COMPARISON:  11/17/2016 FINDINGS: The heart size is within normal limits. Tortuous aorta. Both lungs are clear. Mild degenerative changes of  the spine. IMPRESSION: No active cardiopulmonary disease. Electronically Signed   By: Jasmine Pang M.D.   On: 03/02/2018 19:18    ____________________________________________    PROCEDURES  Procedure(s) performed:    Procedures    Medications - No data to display   ____________________________________________   INITIAL IMPRESSION / ASSESSMENT AND PLAN / ED COURSE  Pertinent labs & imaging results that were available during my care of the patient were reviewed by me and considered in my medical decision making (see chart for details).  Review of the [place] CSRS was performed in accordance of the [facility] prior to dispensing any controlled drugs.  Clinical Course as of Mar 03 1935  Thu Mar 02, 2018  2130 Patient presents emergency department complaining of cough, shortness of breath x2-1/2 months.  Patient reports that she has a dry, nonproductive cough every day which worsens at nighttime.  Patient does have a history of CHF but does not check her weights on a daily basis.  She denies any increase in extremity edema.  Patient does report that sometimes she feels like she is "choking or drowning".  Overall, exam is reassuring.  No adventitious lung sounds.  Patient does have lower extremity edema but she states that this is baseline.  Differential includes CHF exacerbation, chronic bronchitis/COPD, allergic rhinitis with postnasal drip.  Given patient's medical history, she will be evaluated with EKG, chest x-ray, basic blood work.   [JC]    Clinical Course User Index [JC] , Delorise Royals, PA-C     Patient's diagnosis is consistent with chronic cough, postnasal drip.  Patient presents emergency department complaining of a nonproductive cough for 2-1/2 months.  Patient does have history of CHF, coronary artery disease.  She denies any chest pain.  No increased peripheral edema.  Overall, exam is reassuring.  Findings consistent with allergic rhinitis and postnasal drip are  identified.  Overall, labs and imaging are reassuring.  At this time, I suspect that cough is likely attributed to postnasal drip.  Patient will be trialed on Flonase, Zyrtec, Tessalon Perles.  If symptoms persist, she is to follow-up with primary care.  No indication for further work-up at this time..  Patient is given ED precautions to return to the ED for any worsening or new symptoms.     ____________________________________________  FINAL CLINICAL IMPRESSION(S) / ED DIAGNOSES  Final diagnoses:  Cough  Allergic rhinitis with postnasal drip      NEW MEDICATIONS STARTED DURING THIS VISIT:  ED Discharge Orders         Ordered    fluticasone (FLONASE) 50 MCG/ACT nasal spray  2 times daily  03/02/18 1937    cetirizine (ZYRTEC) 10 MG tablet  Daily     03/02/18 1937    benzonatate (TESSALON PERLES) 100 MG capsule  At bedtime PRN     03/02/18 1937              This chart was dictated using voice recognition software/Dragon. Despite best efforts to proofread, errors can occur which can change the meaning. Any change was purely unintentional.    Racheal Patches, PA-C 03/02/18 Mable Paris, MD 03/02/18 [phone]

## 2018-03-02 NOTE — ED Notes (Signed)
Reports cough x few weeks, denies fever, denies cp states has been sob/ doe. Labs sent. Hep lock placed to right ac.

## 2018-03-02 NOTE — ED Notes (Signed)
See triage note  States she developed a dry cough about 2 months ago  No fever  States she was seen by PCP but conts to have cough

## 2018-03-22 DIAGNOSIS — J209 Acute bronchitis, unspecified: Secondary | ICD-10-CM | POA: Diagnosis not present

## 2018-03-22 DIAGNOSIS — J019 Acute sinusitis, unspecified: Secondary | ICD-10-CM | POA: Diagnosis not present

## 2019-09-06 ENCOUNTER — Inpatient Hospital Stay
Admission: EM | Admit: 2019-09-06 | Discharge: 2019-09-08 | DRG: 305 | Disposition: A | Discharge status: Home / Self Care | Payer: Medicare HMO | Attending: Internal Medicine | Admitting: Internal Medicine

## 2019-09-06 ENCOUNTER — Encounter: Payer: Self-pay | Admitting: Emergency Medicine

## 2019-09-06 ENCOUNTER — Other Ambulatory Visit: Payer: Self-pay

## 2019-09-06 ENCOUNTER — Emergency Department: Payer: Medicare HMO

## 2019-09-06 DIAGNOSIS — K5790 Diverticulosis of intestine, part unspecified, without perforation or abscess without bleeding: Secondary | ICD-10-CM | POA: Diagnosis present

## 2019-09-06 DIAGNOSIS — Z79899 Other long term (current) drug therapy: Secondary | ICD-10-CM

## 2019-09-06 DIAGNOSIS — Z7982 Long term (current) use of aspirin: Secondary | ICD-10-CM | POA: Diagnosis not present

## 2019-09-06 DIAGNOSIS — Z9114 Patient's other noncompliance with medication regimen: Secondary | ICD-10-CM | POA: Diagnosis not present

## 2019-09-06 DIAGNOSIS — Z20822 Contact with and (suspected) exposure to covid-19: Secondary | ICD-10-CM | POA: Diagnosis present

## 2019-09-06 DIAGNOSIS — R001 Bradycardia, unspecified: Secondary | ICD-10-CM | POA: Diagnosis present

## 2019-09-06 DIAGNOSIS — I160 Hypertensive urgency: Secondary | ICD-10-CM | POA: Diagnosis not present

## 2019-09-06 DIAGNOSIS — R42 Dizziness and giddiness: Secondary | ICD-10-CM | POA: Diagnosis not present

## 2019-09-06 DIAGNOSIS — I2510 Atherosclerotic heart disease of native coronary artery without angina pectoris: Secondary | ICD-10-CM | POA: Diagnosis present

## 2019-09-06 LAB — CBC
HCT: 38.5 % (ref 36.0–46.0)
Hemoglobin: 12.8 g/dL (ref 12.0–15.0)
MCH: 28.6 pg (ref 26.0–34.0)
MCHC: 33.2 g/dL (ref 30.0–36.0)
MCV: 86.1 fL (ref 80.0–100.0)
Platelets: 219 10*3/uL (ref 150–400)
RBC: 4.47 MIL/uL (ref 3.87–5.11)
RDW: 12.8 % (ref 11.5–15.5)
WBC: 5 10*3/uL (ref 4.0–10.5)
nRBC: 0 % (ref 0.0–0.2)

## 2019-09-06 LAB — BASIC METABOLIC PANEL
Anion gap: 9 (ref 5–15)
BUN: 18 mg/dL (ref 8–23)
CO2: 27 mmol/L (ref 22–32)
Calcium: 9.2 mg/dL (ref 8.9–10.3)
Chloride: 102 mmol/L (ref 98–111)
Creatinine, Ser: 0.95 mg/dL (ref 0.44–1.00)
GFR calc Af Amer: >60 mL/min (ref >60)
GFR calc non Af Amer: 55 mL/min — ABNORMAL LOW (ref >60)
Glucose, Bld: 132 mg/dL — ABNORMAL HIGH (ref 70–99)
Potassium: 4.6 mmol/L (ref 3.5–5.1)
Sodium: 138 mmol/L (ref 135–145)

## 2019-09-06 LAB — URINALYSIS, COMPLETE (UACMP) WITH MICROSCOPIC
Bilirubin Urine: NEGATIVE
Glucose, UA: NEGATIVE mg/dL
Ketones, ur: NEGATIVE mg/dL
Leukocytes,Ua: NEGATIVE
Nitrite: NEGATIVE
Protein, ur: 100 mg/dL — AB
Specific Gravity, Urine: 1.012 (ref 1.005–1.030)
pH: 7 (ref 5.0–8.0)

## 2019-09-06 LAB — SARS CORONAVIRUS 2 BY RT PCR (HOSPITAL ORDER, PERFORMED IN ~~LOC~~ HOSPITAL LAB): SARS Coronavirus 2: NEGATIVE

## 2019-09-06 MED ORDER — SODIUM CHLORIDE 0.9 % IV SOLN: 250.0000 mL | INTRAVENOUS | Status: DC | PRN

## 2019-09-06 MED ORDER — IOHEXOL 300 MG/ML  SOLN
100.0000 mL | Freq: Once | INTRAMUSCULAR | Status: AC | PRN
  Administered 2019-09-06: 100 mL via INTRAVENOUS

## 2019-09-06 MED ORDER — PANTOPRAZOLE SODIUM 40 MG PO TBEC
40.0000 mg | DELAYED_RELEASE_TABLET | Freq: Every day | ORAL | Status: DC
  Administered 2019-09-07 – 2019-09-08 (×2): 40 mg via ORAL
  Filled 2019-09-06 (×2): qty 1

## 2019-09-06 MED ORDER — ACETAMINOPHEN 325 MG PO TABS
650.0000 mg | ORAL_TABLET | Freq: Four times a day (QID) | ORAL | Status: DC | PRN
  Administered 2019-09-07 (×2): 650 mg via ORAL
  Filled 2019-09-06 (×2): qty 2

## 2019-09-06 MED ORDER — ACETAMINOPHEN 325 MG PO TABS
650.0000 mg | ORAL_TABLET | Freq: Once | ORAL | Status: AC
  Administered 2019-09-06: 650 mg via ORAL

## 2019-09-06 MED ORDER — CLONIDINE HCL 0.1 MG PO TABS
0.2000 mg | ORAL_TABLET | Freq: Once | ORAL | Status: AC
  Administered 2019-09-06: 0.2 mg via ORAL
  Filled 2019-09-06: qty 2

## 2019-09-06 MED ORDER — CLONIDINE HCL 0.1 MG PO TABS: 0.1000 mg | ORAL_TABLET | Freq: Three times a day (TID) | ORAL | Status: DC

## 2019-09-06 MED ORDER — TIMOLOL MALEATE 0.5 % OP SOLN: 1.0000 [drp] | Freq: Two times a day (BID) | OPHTHALMIC | Status: DC

## 2019-09-06 MED ORDER — SODIUM CHLORIDE 0.9% FLUSH
3.0000 mL | Freq: Two times a day (BID) | INTRAVENOUS | Status: DC
  Administered 2019-09-06 – 2019-09-08 (×4): 3 mL via INTRAVENOUS

## 2019-09-06 MED ORDER — ENOXAPARIN SODIUM 40 MG/0.4ML ~~LOC~~ SOLN
40.0000 mg | SUBCUTANEOUS | Status: DC
  Administered 2019-09-06 – 2019-09-07 (×2): 40 mg via SUBCUTANEOUS
  Filled 2019-09-06 (×2): qty 0.4

## 2019-09-06 MED ORDER — SODIUM CHLORIDE 0.9% FLUSH: 3.0000 mL | INTRAVENOUS | Status: DC | PRN

## 2019-09-06 MED ORDER — ASPIRIN EC 81 MG PO TBEC
81.0000 mg | DELAYED_RELEASE_TABLET | Freq: Every day | ORAL | Status: DC
  Administered 2019-09-07 – 2019-09-08 (×2): 81 mg via ORAL
  Filled 2019-09-06 (×2): qty 1

## 2019-09-06 MED ORDER — METOPROLOL SUCCINATE ER 50 MG PO TB24: 25.0000 mg | ORAL_TABLET | Freq: Every day | ORAL | Status: DC

## 2019-09-06 MED ORDER — PRAVASTATIN SODIUM 20 MG PO TABS
10.0000 mg | ORAL_TABLET | Freq: Every day | ORAL | Status: DC
  Administered 2019-09-07: 10 mg via ORAL
  Filled 2019-09-06 (×3): qty 1

## 2019-09-06 MED ORDER — LISINOPRIL 5 MG PO TABS
5.0000 mg | ORAL_TABLET | Freq: Every day | ORAL | Status: DC
  Administered 2019-09-06 – 2019-09-07 (×2): 5 mg via ORAL
  Filled 2019-09-06 (×2): qty 1

## 2019-09-06 MED ORDER — LATANOPROST 0.005 % OP SOLN
1.0000 [drp] | Freq: Every day | OPHTHALMIC | Status: DC
  Administered 2019-09-07 (×2): 1 [drp] via OPHTHALMIC
  Filled 2019-09-06: qty 2.5

## 2019-09-06 MED ORDER — NICARDIPINE HCL IN NACL 20-0.86 MG/200ML-% IV SOLN
3.0000 mg/h | INTRAVENOUS | Status: DC
  Administered 2019-09-06 – 2019-09-07 (×3): 5 mg/h via INTRAVENOUS
  Administered 2019-09-07: 3 mg/h via INTRAVENOUS
  Administered 2019-09-07: 5 mg/h via INTRAVENOUS
  Filled 2019-09-06 (×4): qty 200

## 2019-09-06 MED ORDER — CHLORHEXIDINE GLUCONATE CLOTH 2 % EX PADS
6.0000 | MEDICATED_PAD | Freq: Every day | CUTANEOUS | Status: DC
  Administered 2019-09-06 – 2019-09-08 (×3): 6 via TOPICAL
  Filled 2019-09-06 (×2): qty 6

## 2019-09-06 MED ORDER — ACETAMINOPHEN 325 MG PO TABS
ORAL_TABLET | ORAL | Status: AC
  Filled 2019-09-06: qty 2

## 2019-09-06 MED ORDER — FLUTICASONE PROPIONATE 50 MCG/ACT NA SUSP
1.0000 | Freq: Two times a day (BID) | NASAL | Status: DC
  Administered 2019-09-06 – 2019-09-08 (×4): 1 via NASAL
  Filled 2019-09-06: qty 16

## 2019-09-06 MED ORDER — LISINOPRIL 5 MG PO TABS
5.0000 mg | ORAL_TABLET | Freq: Once | ORAL | Status: AC
  Administered 2019-09-06: 5 mg via ORAL
  Filled 2019-09-06: qty 1

## 2019-09-06 MED ORDER — ONDANSETRON HCL 4 MG PO TABS
4.0000 mg | ORAL_TABLET | Freq: Four times a day (QID) | ORAL | Status: DC | PRN
  Administered 2019-09-08: 4 mg via ORAL
  Filled 2019-09-06: qty 1

## 2019-09-06 MED ORDER — SODIUM CHLORIDE 0.9% FLUSH: 3.0000 mL | Freq: Once | INTRAVENOUS | Status: DC

## 2019-09-06 MED ORDER — HYDRALAZINE HCL 50 MG PO TABS
50.0000 mg | ORAL_TABLET | Freq: Three times a day (TID) | ORAL | Status: DC
  Administered 2019-09-06 – 2019-09-08 (×6): 50 mg via ORAL
  Filled 2019-09-06 (×6): qty 1

## 2019-09-06 MED ORDER — ONDANSETRON HCL 4 MG/2ML IJ SOLN
4.0000 mg | Freq: Four times a day (QID) | INTRAMUSCULAR | Status: DC | PRN
  Administered 2019-09-06: 4 mg via INTRAVENOUS
  Filled 2019-09-06: qty 2

## 2019-09-06 NOTE — ED Provider Notes (Signed)
[facility] Emergency Department Provider Note    First MD Initiated Contact with Patient 09/06/19 1257     (approximate)  I have reviewed the triage vital signs and the nursing notes.   HISTORY  Chief Complaint Dizziness    HPI Teresa Holt is a 85 y.o. female presents to the ER for evaluation of blurry vision nausea and weakness.  Did not take her blood pressure medication today.  Was drawn to get an outpatient clinic for a checkup and was directed to our clinic walk-in clinic and was found to have significantly elevated blood pressure and sent to the ER.  Patient not complaining of any visual field cut no lateralizing weakness.  Does have a mild headache but has had history of similar headaches.  Denies any chest pain or shortness of breath at this time.    Past Medical History:  Diagnosis Date  . Anxiety   . CHF (congestive heart failure) (HCC)   . Coronary artery disease    No family history on file. Past Surgical History:  Procedure Laterality Date  . ABDOMINAL HYSTERECTOMY    . TONSILLECTOMY     There are no problems to display for this patient.     Prior to Admission medications   Medication Sig Start Date End Date Taking? Authorizing Provider  acetaminophen (TYLENOL) 500 MG tablet Take 500 mg by mouth every 6 (six) hours as needed.    [provider]  aspirin EC 81 MG tablet Take 81 mg by mouth daily.    [provider]  azithromycin (ZITHROMAX Z-PAK) 250 MG tablet take 1 tablet by mouth daily starting tomorrow 11/17/16   [name], Jonathan E, MD  cetirizine (ZYRTEC) 10 MG tablet Take 1 tablet (10 mg total) by mouth daily. 03/02/18   Cuthriell, Delorise RoyalsJonathan D, PA-C  chlorpheniramine-HYDROcodone (TUSSIONEX PENNKINETIC ER) 10-8 MG/5ML SUER Take 5 mLs by mouth 2 (two) times daily. 11/17/16   [name], Jonathan E, MD  cloNIDine (CATAPRES) 0.1 MG tablet Take 0.1 mg by mouth 3 (three) times daily. 09/16/16   [provider]    cyanocobalamin ([employer] VITAMIN B12) 2000 MCG tablet Take 2,000 mcg by mouth daily.    [provider]  fluticasone (FLONASE) 50 MCG/ACT nasal spray Place 1 spray into both nostrils 2 (two) times daily. 03/02/18   Cuthriell, Delorise RoyalsJonathan D, PA-C  hydrochlorothiazide (HYDRODIURIL) 12.5 MG tablet Take 12.5 mg by mouth daily. 09/16/16   [provider]  ibuprofen (ADVIL,MOTRIN) 200 MG tablet Take 200 mg by mouth every 6 (six) hours as needed.    [provider]  latanoprost (XALATAN) 0.005 % ophthalmic solution Place 1 drop into both eyes at bedtime. 09/16/16   [provider]  lovastatin (MEVACOR) 20 MG tablet Take 20 mg by mouth at bedtime. 09/16/16   [provider]  metoprolol succinate (TOPROL-XL) 50 MG 24 hr tablet Take 25 mg by mouth daily. 09/16/16   [provider]  omeprazole (PRILOSEC) 20 MG capsule Take 20 mg by mouth daily. 09/16/16   [provider]  sucralfate (CARAFATE) 1 g tablet Take 1 tablet (1 g total) by mouth 4 (four) times daily as needed (for abdominal discomfort, nausea, and/or vomiting). Patient not taking: Reported on 11/17/2016 10/02/15   Loleta RoseForbach, Cory, MD    Allergies Patient has no known allergies.    Social History Social History   Tobacco Use  . Smoking status: Never Smoker  . Smokeless tobacco: Never Used  Substance Use Topics  .  Alcohol use: No  . Drug use: No    Review of Systems Patient denies headaches, rhinorrhea, blurry vision, numbness, shortness of breath, chest pain, edema, cough, abdominal pain, nausea, vomiting, diarrhea, dysuria, fevers, rashes or hallucinations unless otherwise stated above in HPI. ____________________________________________   PHYSICAL EXAM:  VITAL SIGNS: Vitals:   09/06/19 1444 09/06/19 1506  BP: (!) 220/174 114/74  Pulse: (!) 53 (!) 48  Resp: 16 16  Temp:    SpO2: 100% 98%    Constitutional: Alert and oriented.  Eyes: Conjunctivae are normal.  Head:  Atraumatic. Nose: No congestion/rhinnorhea. Mouth/Throat: Mucous membranes are moist.   Neck: No stridor. Painless ROM.  Cardiovascular: Normal rate, regular rhythm. Grossly normal heart sounds.  Good peripheral circulation. Respiratory: Normal respiratory effort.  No retractions. Lungs CTAB. Gastrointestinal: Soft and nontender. No distention. No abdominal bruits. No CVA tenderness. Genitourinary:  Musculoskeletal: No lower extremity tenderness nor edema.  No joint effusions. Neurologic:  Normal speech and language. No gross focal neurologic deficits are appreciated. No facial droop Skin:  Skin is warm, dry and intact. No rash noted. Psychiatric: Mood and affect are normal. Speech and behavior are normal.  ____________________________________________   LABS (all labs ordered are listed, but only abnormal results are displayed)  Results for orders placed or performed during the hospital encounter of 09/06/19 (from the past 24 hour(s))  Basic metabolic panel     Status: Abnormal   Collection Time: 09/06/19 10:06 AM  Result Value Ref Range   Sodium 138 135 - 145 mmol/L   Potassium 4.6 3.5 - 5.1 mmol/L   Chloride 102 98 - 111 mmol/L   CO2 27 22 - 32 mmol/L   Glucose, Bld 132 (H) 70 - 99 mg/dL   BUN 18 8 - 23 mg/dL   Creatinine, Ser 1.85 0.44 - 1.00 mg/dL   Calcium 9.2 8.9 - 63.1 mg/dL   GFR calc non Af Amer 55 (L) >60 mL/min   GFR calc Af Amer >60 >60 mL/min   Anion gap 9 5 - 15  CBC     Status: None   Collection Time: 09/06/19 10:06 AM  Result Value Ref Range   WBC 5.0 4.0 - 10.5 K/uL   RBC 4.47 3.87 - 5.11 MIL/uL   Hemoglobin 12.8 12.0 - 15.0 g/dL   HCT 49.7 36 - 46 %   MCV 86.1 80.0 - 100.0 fL   MCH 28.6 26.0 - 34.0 pg   MCHC 33.2 30.0 - 36.0 g/dL   RDW 02.6 37.8 - 58.8 %   Platelets 219 150 - 400 K/uL   nRBC 0.0 0.0 - 0.2 %  Urinalysis, Complete w Microscopic     Status: Abnormal   Collection Time: 09/06/19  1:31 PM  Result Value Ref Range   Color, Urine YELLOW (A)  YELLOW   APPearance HAZY (A) CLEAR   Specific Gravity, Urine 1.012 1.005 - 1.030   pH 7.0 5.0 - 8.0   Glucose, UA NEGATIVE NEGATIVE mg/dL   Hgb urine dipstick SMALL (A) NEGATIVE   Bilirubin Urine NEGATIVE NEGATIVE   Ketones, ur NEGATIVE NEGATIVE mg/dL   Protein, ur 502 (A) NEGATIVE mg/dL   Nitrite NEGATIVE NEGATIVE   Leukocytes,Ua NEGATIVE NEGATIVE   RBC / HPF 0-5 0 - 5 RBC/hpf   WBC, UA 0-5 0 - 5 WBC/hpf   Bacteria, UA RARE (A) NONE SEEN   Squamous Epithelial / LPF 0-5 0 - 5   ____________________________________________  EKG My review and personal interpretation at Time: 9:48  Indication: htn  Rate: 50  Rhythm: sinuns Axis: normal Other: normal intervals, no stemi ____________________________________________  RADIOLOGY  I personally reviewed all radiographic images ordered to evaluate for the above acute complaints and reviewed radiology reports and findings.  These findings were personally discussed with the patient.  Please see medical record for radiology report.  ____________________________________________   PROCEDURES  Procedure(s) performed:  .Critical Care Performed by: Willy Eddy, MD Authorized by: Willy Eddy, MD   Critical care provider statement:    Critical care time (minutes):  35   Critical care time was exclusive of:  Separately billable procedures and treating other patients   Critical care was necessary to treat or prevent imminent or life-threatening deterioration of the following conditions:  Circulatory failure   Critical care was time spent personally by me on the following activities:  Development of treatment plan with patient or surrogate, discussions with consultants, evaluation of patient's response to treatment, examination of patient, obtaining history from patient or surrogate, ordering and performing treatments and interventions, ordering and review of laboratory studies, ordering and review of radiographic studies, pulse  oximetry, re-evaluation of patient's condition and review of old charts      Critical Care performed: yes ____________________________________________   INITIAL IMPRESSION / ASSESSMENT AND PLAN / ED COURSE  Pertinent labs & imaging results that were available during my care of the patient were reviewed by me and considered in my medical decision making (see chart for details).   DDX: CVA, bleed, mesenteric ischemia, hypertensive emergency, hypertensive urgency, medication noncompliance, electrolyte abnormality  Teresa Holt is a 85 y.o. who presents to the ED with symptoms as described above.  Patient nontoxic-appearing protecting her airway.  Does not have any lateralizing deficits complaining of blurry vision but has significantly elevated blood pressure.  States that she did not take her blood pressure medications this morning will reorder her home meds and quickly reassess to see where we are.  Given her complaints of headache blurry vision as well as some abdominal pain will order CT imaging to evaluate for acute intra-abdominal process.  Anticipate patient will require hospitalization.  Clinical Course as of Sep 06 1511  Thu Sep 06, 2019  1423 Blood pressure is still critically high despite reducing oral medications.  I am getting go ahead and start her on IV antihypertensive medication infusion.  Pending CT imaging will require hospitalization for hypertensive urgency.   [PR]    Clinical Course User Index [PR] Willy Eddy, MD   ----------------------------------------- 3:13 PM on 09/06/2019 -----------------------------------------  Patient started on Cardene infusion.  CT imaging fortunately is reassuring.  Discussed case with hospitalist for admission  The patient was evaluated in Emergency Department today for the symptoms described in the history of present illness. He/she was evaluated in the context of the global COVID-19 pandemic, which necessitated consideration  that the patient might be at risk for infection with the SARS-CoV-2 virus that causes COVID-19. Institutional protocols and algorithms that pertain to the evaluation of patients at risk for COVID-19 are in a state of rapid change based on information released by regulatory bodies including the CDC and federal and state organizations. These policies and algorithms were followed during the patient's care in the ED.  As part of my medical decision making, I reviewed the following data within the electronic MEDICAL RECORD NUMBER Nursing notes reviewed and incorporated, Labs reviewed, notes from prior ED visits and [place] Controlled Substance Database   ____________________________________________   FINAL CLINICAL IMPRESSION(S) / ED DIAGNOSES  Final diagnoses:  Hypertensive urgency      NEW MEDICATIONS STARTED DURING THIS VISIT:  New Prescriptions   No medications on file     Note:  This document was prepared using Dragon voice recognition software and may include unintentional dictation errors.    Willy Eddy, MD 09/06/19 [phone]

## 2019-09-06 NOTE — ED Notes (Signed)
Pt ambulatory to toilet, steady gait noted.  

## 2019-09-06 NOTE — ED Notes (Signed)
Patient transported to CT 

## 2019-09-06 NOTE — ED Notes (Signed)
Report off to mac rn  

## 2019-09-06 NOTE — ED Triage Notes (Signed)
C/O dizziness  X 1 day. Also c/o nausea.  Also c/o headache intermittently for a while.  Patient states has not taken medication this morning.  AAOx3.  Skin warm and dry.  NAD

## 2019-09-06 NOTE — ED Notes (Signed)
Pt alert  Resting quietly  Waiting on admission

## 2019-09-06 NOTE — ED Notes (Signed)
Pt alert  No scute distress.  Sinus brady on monitor.  Iv meds infusing  Pt waiting on admission bed.

## 2019-09-06 NOTE — H&P (Signed)
History and Physical    Teresa Holt DQQ:[id number] DOB: [birth year] DOA: 09/06/2019  PCP: Patient, No Pcp Per   Patient coming from: Home  I have personally briefly reviewed patient's old medical records in [facility]  Chief Complaint: Dizziness  HPI: Teresa Holt is a 85 y.o. female with medical history significant for hypertension.  She was referred to the emergency room from the walk-in clinic after she was noted to have significantly elevated blood pressure.  Patient had not taken her medications at that time . She complained of dizziness associated with nausea and severe headache which she rated an 8 x 10 in intensity at its worst.  Headache is mostly frontal and worse around the eyes and is associated with blurred vision.  She also has bilateral lower extremity swelling she denies any emesis.  She denies having any chest pain, no shortness of breath, no fever, no cough, no urinary symptoms or changes in her bowel habits. Patient states that she is compliant with her medications and denies having any dietary indiscretion. Patient's blood pressure was 220/174 in the ER she was started on a Cardene drip. CT scan of the head without contrast showed no evidence of acute intracranial hemorrhage or infarct. CT scan of abdomen and pelvis showed rectosigmoid diverticulosis. Twelve-lead EKG reviewed by me showed sinus bradycardia  ED Course: Patient is an 85 year old African-American female who was sent to the emergency room from the walk-in clinic for evaluation of significantly elevated blood pressure associated with headache, dizziness and nausea.  Her blood pressure at arrival to the ER was 220/174.  Patient was given a dose of clonidine and started on Cardene drip.  Review of Systems: As per HPI otherwise 10 point review of systems negative.    Past Medical History:  Diagnosis Date  . Anxiety   . CHF (congestive heart failure) (HCC)   . Coronary artery disease     Past Surgical  History:  Procedure Laterality Date  . ABDOMINAL HYSTERECTOMY    . TONSILLECTOMY       reports that she has never smoked. She has never used smokeless tobacco. She reports that she does not drink alcohol and does not use drugs.  No Known Allergies  No family history on file.   Prior to Admission medications   Medication Sig Start Date End Date Taking? Authorizing Provider  ondansetron (ZOFRAN-ODT) 4 MG disintegrating tablet Take 4 mg by mouth every 12 (twelve) hours as needed for nausea. 08/22/19  Yes [provider]  acetaminophen (TYLENOL) 500 MG tablet Take 500 mg by mouth every 6 (six) hours as needed.    [provider]  aspirin EC 81 MG tablet Take 81 mg by mouth daily.    [provider]  cloNIDine (CATAPRES) 0.1 MG tablet Take 0.1 mg by mouth 3 (three) times daily. 09/16/16   [provider]  fluticasone (FLONASE) 50 MCG/ACT nasal spray Place 1 spray into both nostrils 2 (two) times daily. 03/02/18   Cuthriell, Delorise Royals, PA-C  ibuprofen (ADVIL,MOTRIN) 200 MG tablet Take 200 mg by mouth every 6 (six) hours as needed.    [provider]  latanoprost (XALATAN) 0.005 % ophthalmic solution Place 1 drop into both eyes at bedtime. 09/16/16   [provider]  lisinopril (ZESTRIL) 5 MG tablet Take 5 mg by mouth daily. 08/21/19   [provider]  lovastatin (MEVACOR) 20 MG tablet Take 20 mg by mouth at bedtime. 09/16/16   [provider]  metoprolol succinate (  TOPROL-XL) 50 MG 24 hr tablet Take 25 mg by mouth daily. 09/16/16   [provider]  omeprazole (PRILOSEC) 20 MG capsule Take 20 mg by mouth daily. 09/16/16   [provider]  timolol (TIMOPTIC) 0.5 % ophthalmic solution Place 1 drop into both eyes 2 (two) times daily. 08/21/19   [provider]    Physical Exam: Vitals:   09/06/19 1506 09/06/19 1531 09/06/19 1532 09/06/19 1534  BP: 114/74     Pulse: (!) 48 92 94 (!) 46  Resp: 16 (!) 22  (!) 23 13  Temp:      TempSrc:      SpO2: 98% 93% 94% 98%  Weight:      Height:         Vitals:   09/06/19 1506 09/06/19 1531 09/06/19 1532 09/06/19 1534  BP: 114/74     Pulse: (!) 48 92 94 (!) 46  Resp: 16 (!) 22 (!) 23 13  Temp:      TempSrc:      SpO2: 98% 93% 94% 98%  Weight:      Height:        Constitutional: NAD, alert and oriented x 3 Eyes: PERRL, lids and conjunctivae normal ENMT: Mucous membranes are moist.  Neck: normal, supple, no masses, no thyromegaly Respiratory: clear to auscultation bilaterally, no wheezing, no crackles. Normal respiratory effort. No accessory muscle use.  Cardiovascular: Bradycardia, no murmurs / rubs / gallops. 2+ extremity edema. 2+ pedal pulses. No carotid bruits.  Abdomen: no tenderness, no masses palpated. No hepatosplenomegaly. Bowel sounds positive.  Musculoskeletal: no clubbing / cyanosis. No joint deformity upper and lower extremities.  Skin: no rashes, lesions, ulcers.  Neurologic: No gross focal neurologic deficit. Psychiatric: Normal mood and affect.   Labs on Admission: I have personally reviewed following labs and imaging studies  CBC: Recent Labs  Lab 09/06/19 1006  WBC 5.0  HGB 12.8  HCT 38.5  MCV 86.1  PLT 219   Basic Metabolic Panel: Recent Labs  Lab 09/06/19 1006  NA 138  K 4.6  CL 102  CO2 27  GLUCOSE 132*  BUN 18  CREATININE 0.95  CALCIUM 9.2   GFR: Estimated Creatinine Clearance: 49.7 mL/min (by C-G formula based on SCr of 0.95 mg/dL). Liver Function Tests: No results for input(s): AST, ALT, ALKPHOS, BILITOT, PROT, ALBUMIN in the last 168 hours. No results for input(s): LIPASE, AMYLASE in the last 168 hours. No results for input(s): AMMONIA in the last 168 hours. Coagulation Profile: No results for input(s): INR, PROTIME in the last 168 hours. Cardiac Enzymes: No results for input(s): CKTOTAL, CKMB, CKMBINDEX, TROPONINI in the last 168 hours. BNP (last 3 results) No results for input(s):  PROBNP in the last 8760 hours. HbA1C: No results for input(s): HGBA1C in the last 72 hours. CBG: No results for input(s): GLUCAP in the last 168 hours. Lipid Profile: No results for input(s): CHOL, HDL, LDLCALC, TRIG, CHOLHDL, LDLDIRECT in the last 72 hours. Thyroid Function Tests: No results for input(s): TSH, T4TOTAL, FREET4, T3FREE, THYROIDAB in the last 72 hours. Anemia Panel: No results for input(s): VITAMINB12, FOLATE, FERRITIN, TIBC, IRON, RETICCTPCT in the last 72 hours. Urine analysis:    Component Value Date/Time   COLORURINE YELLOW (A) 09/06/2019 1331   APPEARANCEUR HAZY (A) 09/06/2019 1331   APPEARANCEUR Clear 11/29/2013 1720   LABSPEC 1.012 09/06/2019 1331   LABSPEC 1.016 11/29/2013 1720   PHURINE 7.0 09/06/2019 1331   GLUCOSEU NEGATIVE 09/06/2019 1331   GLUCOSEU  Negative 11/29/2013 1720   HGBUR SMALL (A) 09/06/2019 1331   BILIRUBINUR NEGATIVE 09/06/2019 1331   BILIRUBINUR Negative 11/29/2013 1720   KETONESUR NEGATIVE 09/06/2019 1331   PROTEINUR 100 (A) 09/06/2019 1331   NITRITE NEGATIVE 09/06/2019 1331   LEUKOCYTESUR NEGATIVE 09/06/2019 1331   LEUKOCYTESUR Trace 11/29/2013 1720    Radiological Exams on Admission: CT Head Wo Contrast  Result Date: 09/06/2019 CLINICAL DATA:  Headache, acute, normal neuro exam EXAM: CT HEAD WITHOUT CONTRAST TECHNIQUE: Contiguous axial images were obtained from the base of the skull through the vertex without intravenous contrast. COMPARISON:  11/29/2013 FINDINGS: Brain: Normal anatomic configuration. Mild parenchymal volume loss is commensurate with the patient's age. No abnormal intra or extra-axial mass lesion or fluid collection. No abnormal mass effect or midline shift. No evidence of acute intracranial hemorrhage or infarct. Ventricular size is normal. Cerebellum unremarkable. Vascular: Unremarkable Skull: Intact Sinuses/Orbits: Mucous retention cyst noted within the left sphenoid sinus. The left frontal sinus is relatively  hypoplastic. The paranasal sinuses are otherwise clear. Orbits are unremarkable. Other: Mastoid air cells and middle ear cavities are clear. IMPRESSION: No evidence of acute intracranial hemorrhage or infarct. Electronically Signed   By: Helyn Numbers MD   On: 09/06/2019 14:58   CT ABDOMEN PELVIS W CONTRAST  Result Date: 09/06/2019 CLINICAL DATA:  Normal distension.  Nausea.  Headache. EXAM: CT ABDOMEN AND PELVIS WITH CONTRAST TECHNIQUE: Multidetector CT imaging of the abdomen and pelvis was performed using the standard protocol following bolus administration of intravenous contrast. CONTRAST:  OMNIPAQUE IOHEXOL 300 MG/ML  SOLN COMPARISON:  CT dated 10/02/2015. FINDINGS: Lower chest: The lung bases are clear. The heart size is normal. Hepatobiliary: The liver is normal. Normal gallbladder.There is no biliary ductal dilation. Pancreas: Normal contours without ductal dilatation. No peripancreatic fluid collection. Spleen: Unremarkable. Adrenals/Urinary Tract: --Adrenal glands: Unremarkable. --Right kidney/ureter: Small presumed cysts are noted. --Left kidney/ureter: There is an area of cortical scarring involving the interpolar region of the left kidney. --Urinary bladder: Unremarkable. Stomach/Bowel: --Stomach/Duodenum: No hiatal hernia or other gastric abnormality. Normal duodenal course and caliber. --Small bowel: Unremarkable. --Colon: Rectosigmoid diverticulosis without acute inflammation. --Appendix: Normal. Vascular/Lymphatic: Atherosclerotic calcification is present within the non-aneurysmal abdominal aorta, without hemodynamically significant stenosis. --No retroperitoneal lymphadenopathy. --No mesenteric lymphadenopathy. --No pelvic or inguinal lymphadenopathy. Reproductive: Unremarkable Other: No ascites or free air. The abdominal wall is normal. Musculoskeletal. No acute displaced fractures. IMPRESSION: 1. No acute abdominopelvic abnormality. 2. Rectosigmoid diverticulosis without acute  inflammation. Aortic Atherosclerosis (ICD10-I70.0). Electronically Signed   By: Katherine Mantle M.D.   On: 09/06/2019 14:57    EKG: Independently reviewed.  Sinus bradycardia  Assessment/Plan Principal Problem:   Hypertensive urgency Active Problems:   Coronary artery disease     Hypertensive urgency Patient presents to the emergency room for evaluation of dizziness, nausea and headache and noted to have significantly elevated blood pressure Continue Cardene drip Will discontinue clonidine, timolol eyedrops and metoprolol due to bradycardia We will consider placing patient on amlodipine as she has been weaned off the Cardene drip Continue hydralazine and lisinopril   History of coronary artery disease Stable Continue aspirin and statins Patient not on a beta-blocker due to bradycardia    DVT prophylaxis: Lovenox  Code Status: Full code Family Communication: Greater than 50% of time plan of care with patient at the bedside.  She verbalizes understanding and agrees with. Disposition Plan: Back to previous home environment Consults called: None    Aubery Douthat MD [facility]  09/06/2019, 4:04 PM

## 2019-09-07 DIAGNOSIS — I160 Hypertensive urgency: Principal | ICD-10-CM

## 2019-09-07 LAB — CBC
HCT: 40.6 % (ref 36.0–46.0)
Hemoglobin: 13.4 g/dL (ref 12.0–15.0)
MCH: 28.9 pg (ref 26.0–34.0)
MCHC: 33 g/dL (ref 30.0–36.0)
MCV: 87.7 fL (ref 80.0–100.0)
Platelets: 211 10*3/uL (ref 150–400)
RBC: 4.63 MIL/uL (ref 3.87–5.11)
RDW: 12.9 % (ref 11.5–15.5)
WBC: 7.4 10*3/uL (ref 4.0–10.5)
nRBC: 0 % (ref 0.0–0.2)

## 2019-09-07 LAB — BASIC METABOLIC PANEL
Anion gap: 10 (ref 5–15)
BUN: 15 mg/dL (ref 8–23)
CO2: 27 mmol/L (ref 22–32)
Calcium: 9.2 mg/dL (ref 8.9–10.3)
Chloride: 99 mmol/L (ref 98–111)
Creatinine, Ser: 0.73 mg/dL (ref 0.44–1.00)
GFR calc Af Amer: >60 mL/min (ref >60)
GFR calc non Af Amer: >60 mL/min (ref >60)
Glucose, Bld: 139 mg/dL — ABNORMAL HIGH (ref 70–99)
Potassium: 3.6 mmol/L (ref 3.5–5.1)
Sodium: 136 mmol/L (ref 135–145)

## 2019-09-07 LAB — MRSA PCR SCREENING: MRSA by PCR: NEGATIVE

## 2019-09-07 MED ORDER — ENSURE ENLIVE PO LIQD
237.0000 mL | Freq: Two times a day (BID) | ORAL | Status: DC
  Administered 2019-09-07 – 2019-09-08 (×3): 237 mL via ORAL

## 2019-09-07 MED ORDER — PROMETHAZINE HCL 25 MG/ML IJ SOLN
12.5000 mg | Freq: Once | INTRAMUSCULAR | Status: AC
  Administered 2019-09-07: 12.5 mg via INTRAVENOUS

## 2019-09-07 MED ORDER — OXYCODONE HCL 5 MG PO TABS
5.0000 mg | ORAL_TABLET | Freq: Four times a day (QID) | ORAL | Status: DC | PRN
  Administered 2019-09-07 – 2019-09-08 (×4): 5 mg via ORAL
  Filled 2019-09-07 (×4): qty 1

## 2019-09-07 NOTE — Progress Notes (Signed)
Initial Nutrition Assessment  DOCUMENTATION CODES:   Obesity unspecified  INTERVENTION:   Ensure Enlive po BID, each supplement provides 350 kcal and 20 grams of protein  NUTRITION DIAGNOSIS:   Inadequate oral intake related to acute illness as evidenced by per patient/family report.  GOAL:   Patient will meet greater than or equal to 90% of their needs  MONITOR:   PO intake, Supplement acceptance, Labs, Weight trends, Skin, I & O's  REASON FOR ASSESSMENT:   Malnutrition Screening Tool    ASSESSMENT:   85 y/o female with h/o anxiety, HTN, CAD and HTN who id admitted with hypertensive emergency   Met with pt in room today. Pt reports good appetite and oral intake at baseline. Pt reports poor appetite and oral intake for 1 day pta. Pt reports that she ate ~25% of her breakfast this morning which included eggs and breakfast potatoes. Pt reports that she does enjoy drinking Ensure and she drinks these at home. RD will add supplements to help pt meet her estimated needs. RD briefly discussed low sodium diet with pt.   Per chart, pt is weight stable at baseline.   Medications reviewed and include: aspirin, lovenox  Labs reviewed:   NUTRITION - FOCUSED PHYSICAL EXAM:    Most Recent Value  Orbital Region No depletion  Upper Arm Region No depletion  Thoracic and Lumbar Region No depletion  Buccal Region No depletion  Temple Region Mild depletion  Clavicle Bone Region Mild depletion  Clavicle and Acromion Bone Region Mild depletion  Scapular Bone Region No depletion  Dorsal Hand No depletion  Patellar Region Mild depletion  Anterior Thigh Region No depletion  Posterior Calf Region No depletion  Edema (RD Assessment) None  Hair Reviewed  Eyes Reviewed  Mouth Reviewed  Skin Reviewed  Nails Reviewed     Diet Order:   Diet Order            Diet 2 gram sodium Room service appropriate? Yes; Fluid consistency: Thin  Diet effective now                EDUCATION  NEEDS:   Education needs have been addressed  Skin:  Skin Assessment: Reviewed RN Assessment  Last BM:  pta  Height:   Ht Readings from Last 1 Encounters:  09/06/19 '5\' 3"'$  (1.6 m)    Weight:   Wt Readings from Last 1 Encounters:  09/06/19 82 kg    Ideal Body Weight:  52.3 kg  BMI:  Body mass index is 32.02 kg/m.  Estimated Nutritional Needs:   Kcal:  1600-1800kcal/day  Protein:  80-90g/day  Fluid:  1.5L/day  Koleen Distance MS, RD, LDN Please refer to [facility] for RD and/or RD on-call/weekend/after hours pager

## 2019-09-07 NOTE — Progress Notes (Signed)
PROGRESS NOTE    Teresa Holt    Code Status: Full Code  ZOX:[id number] DOB: 09-Feb-1935 DOA: 09/06/2019 LOS: 1 days  PCP: Patient, No Pcp Per CC:  Chief Complaint  Patient presents with  . Dizziness       Hospital Summary   This is an 85 year old female with a history of hypertension who presented to the ED after being sent in by the urgent care where she was noted to have hypertensive crisis presented to the urgent care for dizziness, nausea and severe headache.  ED course: Blood pressure 220/174.  CT head negative.  CT abdomen pelvis with diverticulosis without inflammation.  Ordered on Cardene drip and admitted to SDU.  7/9: Titrated off Cardene drip and transferred out of stepdown unit.  Still with headache    A & P   Principal Problem:   Hypertensive urgency Active Problems:   Coronary artery disease   1. Hypertensive urgency likely secondary to rebound hypertension from missing doses of clonidine a. Off Cardene drip and doing well b. Continue home lisinopril and hydralazine c. Hold clonidine, if stable off clonidine then this should be held at discharge  2. Headache a. Should resolve with improved blood pressures and Tylenol  3. CAD, stable on aspirin and statin a. Not on beta-blocker due to bradycardia  4. Abdominal pain unknown etiology - possibly from hypertensive urgency? a. CT scan with diverticulosis, no inflammation b. monitor  DVT prophylaxis: enoxaparin (LOVENOX) injection 40 mg Start: 09/06/19 2200   Family Communication: no family at bedside  Disposition Plan: Transfer out of SDU. can likely DC tomorrow pending PT eval and stable BP Status is: Inpatient  Remains inpatient appropriate because:Unsafe d/c plan   Dispo: The patient is from: Home              Anticipated d/c is to: Home              Anticipated d/c date is: 1 day              Patient currently is not medically stable to d/c.      Pressure injury documentation     None  Consultants  None  Procedures  None  Antibiotics   Anti-infectives (From admission, onward)   None        Subjective   Patient seen and examined at bedside in no acute distress and resting comfortably. Admits to persistent headache. Speaking very low/softly. No other issues or events.  Objective   Vitals:   09/07/19 1315 09/07/19 1345 09/07/19 1500 09/07/19 1600  BP: (!) 142/78 (!) 152/68 (!) 126/98 104/78  Pulse:    (!) 54  Resp:      Temp:    98.4 F (36.9 C)  TempSrc:    Oral  SpO2:    98%  Weight:      Height:        Intake/Output Summary (Last 24 hours) at 09/07/2019 1906 Last data filed at 09/07/2019 0200 Gross per 24 hour  Intake 411.04 ml  Output 100 ml  Net 311.04 ml   Filed Weights   09/06/19 0958 09/06/19 1009 09/06/19 2220  Weight: 86.2 kg 86.2 kg 82 kg    Examination:  Physical Exam Vitals and nursing note reviewed.  Constitutional:      Appearance: Normal appearance.  HENT:     Head: Normocephalic and atraumatic.  Eyes:     Conjunctiva/sclera: Conjunctivae normal.  Cardiovascular:     Rate and Rhythm: Normal  rate and regular rhythm.  Pulmonary:     Effort: Pulmonary effort is normal.     Breath sounds: Normal breath sounds.  Abdominal:     General: Abdomen is flat.     Palpations: Abdomen is soft.  Musculoskeletal:        General: No swelling or tenderness.  Skin:    Coloration: Skin is not jaundiced or pale.  Neurological:     Mental Status: She is alert. Mental status is at baseline.     Data Reviewed: I have personally reviewed following labs and imaging studies  CBC: Recent Labs  Lab 09/06/19 1006 09/07/19 0409  WBC 5.0 7.4  HGB 12.8 13.4  HCT 38.5 40.6  MCV 86.1 87.7  PLT 219 211   Basic Metabolic Panel: Recent Labs  Lab 09/06/19 1006 09/07/19 0409  NA 138 136  K 4.6 3.6  CL 102 99  CO2 27 27  GLUCOSE 132* 139*  BUN 18 15  CREATININE 0.95 0.73  CALCIUM 9.2 9.2   GFR: Estimated Creatinine  Clearance: 54 mL/min (by C-G formula based on SCr of 0.73 mg/dL). Liver Function Tests: No results for input(s): AST, ALT, ALKPHOS, BILITOT, PROT, ALBUMIN in the last 168 hours. No results for input(s): LIPASE, AMYLASE in the last 168 hours. No results for input(s): AMMONIA in the last 168 hours. Coagulation Profile: No results for input(s): INR, PROTIME in the last 168 hours. Cardiac Enzymes: No results for input(s): CKTOTAL, CKMB, CKMBINDEX, TROPONINI in the last 168 hours. BNP (last 3 results) No results for input(s): PROBNP in the last 8760 hours. HbA1C: No results for input(s): HGBA1C in the last 72 hours. CBG: No results for input(s): GLUCAP in the last 168 hours. Lipid Profile: No results for input(s): CHOL, HDL, LDLCALC, TRIG, CHOLHDL, LDLDIRECT in the last 72 hours. Thyroid Function Tests: No results for input(s): TSH, T4TOTAL, FREET4, T3FREE, THYROIDAB in the last 72 hours. Anemia Panel: No results for input(s): VITAMINB12, FOLATE, FERRITIN, TIBC, IRON, RETICCTPCT in the last 72 hours. Sepsis Labs: No results for input(s): PROCALCITON, LATICACIDVEN in the last 168 hours.  Recent Results (from the past 240 hour(s))  SARS Coronavirus 2 by RT PCR (hospital order, performed in [facility] hospital lab) Nasopharyngeal Nasopharyngeal Swab     Status: None   Collection Time: 09/06/19  8:49 PM   Specimen: Nasopharyngeal Swab  Result Value Ref Range Status   SARS Coronavirus 2 NEGATIVE NEGATIVE Final    Comment: (NOTE) SARS-CoV-2 target nucleic acids are NOT DETECTED.  The SARS-CoV-2 RNA is generally detectable in upper and lower respiratory specimens during the acute phase of infection. The lowest concentration of SARS-CoV-2 viral copies this assay can detect is 250 copies / mL. A negative result does not preclude SARS-CoV-2 infection and should not be used as the sole basis for treatment or other patient management decisions.  A negative result may occur with improper  specimen collection / handling, submission of specimen other than nasopharyngeal swab, presence of viral mutation(s) within the areas targeted by this assay, and inadequate number of viral copies (<250 copies / mL). A negative result must be combined with clinical observations, patient history, and epidemiological information.  Fact Sheet for Patients:   BoilerBrush.com.cy  Fact Sheet for Healthcare Providers: https://pope.com/  This test is not yet approved or  cleared by the [place] FDA and has been authorized for detection and/or diagnosis of SARS-CoV-2 by FDA under an Emergency Use Authorization (EUA).  This EUA will remain in effect (meaning  this test can be used) for the duration of the COVID-19 declaration under Section 564(b)(1) of the Act, 21 U.S.C. section 360bbb-3(b)(1), unless the authorization is terminated or revoked sooner.  Performed at [facility], [address] [place], [place] [address]   MRSA PCR Screening     Status: None   Collection Time: 09/06/19 10:18 PM   Specimen: Nasopharyngeal  Result Value Ref Range Status   MRSA by PCR NEGATIVE NEGATIVE Final    Comment:        The GeneXpert MRSA Assay (FDA approved for NASAL specimens only), is one component of a comprehensive MRSA colonization surveillance program. It is not intended to diagnose MRSA infection nor to guide or monitor treatment for MRSA infections. Performed at [facility], [address], [place], [place] [address]          Radiology Studies: CT Head Wo Contrast  Result Date: 09/06/2019 CLINICAL DATA:  Headache, acute, normal neuro exam EXAM: CT HEAD WITHOUT CONTRAST TECHNIQUE: Contiguous axial images were obtained from the base of the skull through the vertex without intravenous contrast. COMPARISON:  11/29/2013 FINDINGS: Brain: Normal anatomic configuration. Mild parenchymal volume loss is commensurate with  the patient's age. No abnormal intra or extra-axial mass lesion or fluid collection. No abnormal mass effect or midline shift. No evidence of acute intracranial hemorrhage or infarct. Ventricular size is normal. Cerebellum unremarkable. Vascular: Unremarkable Skull: Intact Sinuses/Orbits: Mucous retention cyst noted within the left sphenoid sinus. The left frontal sinus is relatively hypoplastic. The paranasal sinuses are otherwise clear. Orbits are unremarkable. Other: Mastoid air cells and middle ear cavities are clear. IMPRESSION: No evidence of acute intracranial hemorrhage or infarct. Electronically Signed   By: Helyn Numbers MD   On: 09/06/2019 14:58   CT ABDOMEN PELVIS W CONTRAST  Result Date: 09/06/2019 CLINICAL DATA:  Normal distension.  Nausea.  Headache. EXAM: CT ABDOMEN AND PELVIS WITH CONTRAST TECHNIQUE: Multidetector CT imaging of the abdomen and pelvis was performed using the standard protocol following bolus administration of intravenous contrast. CONTRAST:  OMNIPAQUE IOHEXOL 300 MG/ML  SOLN COMPARISON:  CT dated 10/02/2015. FINDINGS: Lower chest: The lung bases are clear. The heart size is normal. Hepatobiliary: The liver is normal. Normal gallbladder.There is no biliary ductal dilation. Pancreas: Normal contours without ductal dilatation. No peripancreatic fluid collection. Spleen: Unremarkable. Adrenals/Urinary Tract: --Adrenal glands: Unremarkable. --Right kidney/ureter: Small presumed cysts are noted. --Left kidney/ureter: There is an area of cortical scarring involving the interpolar region of the left kidney. --Urinary bladder: Unremarkable. Stomach/Bowel: --Stomach/Duodenum: No hiatal hernia or other gastric abnormality. Normal duodenal course and caliber. --Small bowel: Unremarkable. --Colon: Rectosigmoid diverticulosis without acute inflammation. --Appendix: Normal. Vascular/Lymphatic: Atherosclerotic calcification is present within the non-aneurysmal abdominal aorta, without  hemodynamically significant stenosis. --No retroperitoneal lymphadenopathy. --No mesenteric lymphadenopathy. --No pelvic or inguinal lymphadenopathy. Reproductive: Unremarkable Other: No ascites or free air. The abdominal wall is normal. Musculoskeletal. No acute displaced fractures. IMPRESSION: 1. No acute abdominopelvic abnormality. 2. Rectosigmoid diverticulosis without acute inflammation. Aortic Atherosclerosis (ICD10-I70.0). Electronically Signed   By: Katherine Mantle M.D.   On: 09/06/2019 14:57        Scheduled Meds: . aspirin EC  81 mg Oral Daily  . Chlorhexidine Gluconate Cloth  6 each Topical Daily  . enoxaparin (LOVENOX) injection  40 mg Subcutaneous Q24H  . feeding supplement (ENSURE ENLIVE)  237 mL Oral BID BM  . fluticasone  1 spray Each Nare BID  . hydrALAZINE  50 mg Oral Q8H  . latanoprost  1 drop Both Eyes QHS  . lisinopril  5 mg Oral Daily  . pantoprazole  40 mg Oral Daily  . pravastatin  10 mg Oral q1800  . sodium chloride flush  3 mL Intravenous Once  . sodium chloride flush  3 mL Intravenous Q12H   Continuous Infusions: . sodium chloride       Time spent: 30 minutes with over 50% of the time coordinating the patient's care    Jae Dire, DO [facility] Pager [phone]  Call night coverage person covering after 7pm

## 2019-09-07 NOTE — Progress Notes (Signed)
Recive report from ICU nurse Richardo Hanks. Pt is being transported to room 138 by nurse.

## 2019-09-07 NOTE — Progress Notes (Signed)
Patient arived to room 138. Was in W/C. Able to ambulate without assistance. Has steady gait, is A& O x4. Daughter at bedside. Cont of BnB dose use pads for bladder drips at times. Takes medications whole. No c/o pain or discomfort at this time. Orientated to rom call light within reach bed in low potion side rails up x2.

## 2019-09-08 LAB — BASIC METABOLIC PANEL
Anion gap: 10 (ref 5–15)
BUN: 27 mg/dL — ABNORMAL HIGH (ref 8–23)
CO2: 27 mmol/L (ref 22–32)
Calcium: 9 mg/dL (ref 8.9–10.3)
Chloride: 101 mmol/L (ref 98–111)
Creatinine, Ser: 0.85 mg/dL (ref 0.44–1.00)
GFR calc Af Amer: >60 mL/min (ref >60)
GFR calc non Af Amer: >60 mL/min (ref >60)
Glucose, Bld: 125 mg/dL — ABNORMAL HIGH (ref 70–99)
Potassium: 3.4 mmol/L — ABNORMAL LOW (ref 3.5–5.1)
Sodium: 138 mmol/L (ref 135–145)

## 2019-09-08 MED ORDER — LISINOPRIL 10 MG PO TABS: 10.0000 mg | ORAL_TABLET | Freq: Every day | ORAL | Status: DC

## 2019-09-08 MED ORDER — METOPROLOL SUCCINATE ER 25 MG PO TB24: 25.0000 mg | ORAL_TABLET | Freq: Every day | ORAL | 0 refills | Status: AC

## 2019-09-08 MED ORDER — HYDRALAZINE HCL 50 MG PO TABS
100.0000 mg | ORAL_TABLET | Freq: Three times a day (TID) | ORAL | Status: DC
  Administered 2019-09-08: 100 mg via ORAL
  Filled 2019-09-08: qty 2

## 2019-09-08 MED ORDER — METOPROLOL SUCCINATE ER 50 MG PO TB24: 25.0000 mg | ORAL_TABLET | Freq: Every day | ORAL | 0 refills | Status: DC

## 2019-09-08 MED ORDER — METOPROLOL SUCCINATE ER 25 MG PO TB24
12.5000 mg | ORAL_TABLET | Freq: Every day | ORAL | Status: DC
  Administered 2019-09-08: 12.5 mg via ORAL
  Filled 2019-09-08: qty 1

## 2019-09-08 MED ORDER — METOPROLOL SUCCINATE ER 25 MG PO TB24: 25.0000 mg | ORAL_TABLET | Freq: Every day | ORAL | Status: DC

## 2019-09-08 MED ORDER — LISINOPRIL 5 MG PO TABS
5.0000 mg | ORAL_TABLET | Freq: Every day | ORAL | Status: DC
  Administered 2019-09-08: 5 mg via ORAL
  Filled 2019-09-08: qty 1

## 2019-09-08 MED ORDER — HYDRALAZINE HCL 100 MG PO TABS: 100.0000 mg | ORAL_TABLET | Freq: Three times a day (TID) | ORAL | 0 refills | Status: DC

## 2019-09-08 NOTE — Discharge Summary (Signed)
Physician Discharge Summary  Annye Forrey ZTI:[id number] DOB: [birth year]   PCP: Patient, No Pcp Per  Admit date: 09/06/2019 Discharge date: 09/08/2019 Length of Stay: 2 days   Code Status: Full Code  Admitted From:  Home Discharged to:   Home Home Health:  None  Equipment/Devices:  None Discharge Condition:  Stable  Recommendations for Outpatient Follow-up   1. Follow up with PCP in 1 week 2. Follow up BMP/CBC  3. Encourage medication compliance to reduce chance of rebound hypertension  Hospital Summary  This is an 85 year old female with a history of hypertension who presented to the ED after being sent in by the urgent care where she was noted to have hypertensive crisis presented to the urgent care for dizziness, nausea and severe headache.  ED course: Blood pressure 220/174.  CT head negative.  CT abdomen pelvis with diverticulosis without inflammation.    Placed on Cardene drip and admitted to SDU.   7/9: Titrated off Cardene drip and transferred out of stepdown unit.  Reported that she has skipped a few doses here and there of clonidine over the past week.  7/10: Recurrent hypertensive urgency which eventually improved.  She was discharged in stable condition on her usual antihypertensive regimen and advised not to skip any doses of clonidine as this can lead to rebound hypertension which is likely what prompted her to come to the ED. advised to follow-up with her PCP which she already has an appointment scheduled this week with blood pressure recordings.   A & P   Principal Problem:   Hypertensive urgency Active Problems:   Coronary artery disease    1. Hypertensive urgency likely secondary to rebound hypertension from missing doses of clonidine a. Off Cardene drip and doing well b. Continue home medications at discharge as she has been on these for several years without any issue c. PCP follow-up this week with BP recordings  2. Headache secondary to hypertensive  urgency, resolved with improved BP  3. CAD, stable on aspirin and statin a. Resume home meds as she has been stable on this for at least a year  4. Bradycardia a. Resolved and tolerated reduced dosing of Toprol b. Discharged with usual home regimen as she is tolerating this for 1+ years.  If recurrent bradycardia with either decrease or discontinue beta-blocker  4. Abdominal pain unknown etiology - possibly from hypertensive urgency? a. CT scan with diverticulosis, no inflammation    Consultants  . None  Procedures  . None  Antibiotics   Anti-infectives (From admission, onward)   None       Subjective  Patient seen and examined at bedside no acute distress and resting comfortably.  No events overnight.  Daughter at bedside.  Tolerating diet. In good spirits and anticipating discharge.   Blood pressure difficult to control this a.m. but resolved with p.o. antihypertensives.  Had a headache during her elevated blood pressures which resolved by the end of the day.  Able to ambulate.  Denies any chest pain, shortness of breath, fever. Otherwise ROS negative    Objective   Discharge Exam: Vitals:   09/08/19 1236 09/08/19 1403  BP: (!) 173/90 (!) 149/80  Pulse: 82 77  Resp:  18  Temp:  98.2 F (36.8 C)  SpO2:  99%   Vitals:   09/08/19 0347 09/08/19 0748 09/08/19 1236 09/08/19 1403  BP: (!) 164/92 (!) 159/87 (!) 173/90 (!) 149/80  Pulse: 93 (!) 104 82 77  Resp: 17 16  18  Temp: 98.4 F (36.9 C) 98.2 F (36.8 C)  98.2 F (36.8 C)  TempSrc: Oral Oral  Oral  SpO2: 98% 100%  99%  Weight:      Height:        Physical Exam Vitals and nursing note reviewed.  Constitutional:      Appearance: Normal appearance.  HENT:     Head: Normocephalic and atraumatic.  Eyes:     Conjunctiva/sclera: Conjunctivae normal.  Cardiovascular:     Rate and Rhythm: Normal rate and regular rhythm.  Pulmonary:     Effort: Pulmonary effort is normal.     Breath sounds: Normal  breath sounds.  Abdominal:     General: Abdomen is flat.     Palpations: Abdomen is soft.  Musculoskeletal:        General: No swelling or tenderness.  Skin:    Coloration: Skin is not jaundiced or pale.  Neurological:     Mental Status: She is alert. Mental status is at baseline.  Psychiatric:        Mood and Affect: Mood normal.        Behavior: Behavior normal.       The results of significant diagnostics from this hospitalization (including imaging, microbiology, ancillary and laboratory) are listed below for reference.     Microbiology: Recent Results (from the past 240 hour(s))  SARS Coronavirus 2 by RT PCR (hospital order, performed in [facility] hospital lab) Nasopharyngeal Nasopharyngeal Swab     Status: None   Collection Time: 09/06/19  8:49 PM   Specimen: Nasopharyngeal Swab  Result Value Ref Range Status   SARS Coronavirus 2 NEGATIVE NEGATIVE Final    Comment: (NOTE) SARS-CoV-2 target nucleic acids are NOT DETECTED.  The SARS-CoV-2 RNA is generally detectable in upper and lower respiratory specimens during the acute phase of infection. The lowest concentration of SARS-CoV-2 viral copies this assay can detect is 250 copies / mL. A negative result does not preclude SARS-CoV-2 infection and should not be used as the sole basis for treatment or other patient management decisions.  A negative result may occur with improper specimen collection / handling, submission of specimen other than nasopharyngeal swab, presence of viral mutation(s) within the areas targeted by this assay, and inadequate number of viral copies (<250 copies / mL). A negative result must be combined with clinical observations, patient history, and epidemiological information.  Fact Sheet for Patients:   BoilerBrush.com.cy  Fact Sheet for Healthcare Providers: https://pope.com/  This test is not yet approved or  cleared by the [place] FDA  and has been authorized for detection and/or diagnosis of SARS-CoV-2 by FDA under an Emergency Use Authorization (EUA).  This EUA will remain in effect (meaning this test can be used) for the duration of the COVID-19 declaration under Section 564(b)(1) of the Act, 21 U.S.C. section 360bbb-3(b)(1), unless the authorization is terminated or revoked sooner.  Performed at [facility], [address] [place], [place] [address]   MRSA PCR Screening     Status: None   Collection Time: 09/06/19 10:18 PM   Specimen: Nasopharyngeal  Result Value Ref Range Status   MRSA by PCR NEGATIVE NEGATIVE Final    Comment:        The GeneXpert MRSA Assay (FDA approved for NASAL specimens only), is one component of a comprehensive MRSA colonization surveillance program. It is not intended to diagnose MRSA infection nor to guide or monitor treatment for MRSA infections. Performed at [facility],  [address], [place], [place] [address]      Labs: BNP (last 3 results) No results for input(s): BNP in the last 8760 hours. Basic Metabolic Panel: Recent Labs  Lab 09/06/19 1006 09/07/19 0409 09/08/19 0541  NA 138 136 138  K 4.6 3.6 3.4*  CL 102 99 101  CO2 27 27 27   GLUCOSE 132* 139* 125*  BUN 18 15 27*  CREATININE 0.95 0.73 0.85  CALCIUM 9.2 9.2 9.0   Liver Function Tests: No results for input(s): AST, ALT, ALKPHOS, BILITOT, PROT, ALBUMIN in the last 168 hours. No results for input(s): LIPASE, AMYLASE in the last 168 hours. No results for input(s): AMMONIA in the last 168 hours. CBC: Recent Labs  Lab 09/06/19 1006 09/07/19 0409  WBC 5.0 7.4  HGB 12.8 13.4  HCT 38.5 40.6  MCV 86.1 87.7  PLT 219 211   Cardiac Enzymes: No results for input(s): CKTOTAL, CKMB, CKMBINDEX, TROPONINI in the last 168 hours. BNP: Invalid input(s): POCBNP CBG: No results for input(s): GLUCAP in the last 168 hours. D-Dimer No results for input(s): DDIMER in the last 72  hours. Hgb A1c No results for input(s): HGBA1C in the last 72 hours. Lipid Profile No results for input(s): CHOL, HDL, LDLCALC, TRIG, CHOLHDL, LDLDIRECT in the last 72 hours. Thyroid function studies No results for input(s): TSH, T4TOTAL, T3FREE, THYROIDAB in the last 72 hours.  Invalid input(s): FREET3 Anemia work up No results for input(s): VITAMINB12, FOLATE, FERRITIN, TIBC, IRON, RETICCTPCT in the last 72 hours. Urinalysis    Component Value Date/Time   COLORURINE YELLOW (A) 09/06/2019 1331   APPEARANCEUR HAZY (A) 09/06/2019 1331   APPEARANCEUR Clear 11/29/2013 1720   LABSPEC 1.012 09/06/2019 1331   LABSPEC 1.016 11/29/2013 1720   PHURINE 7.0 09/06/2019 1331   GLUCOSEU NEGATIVE 09/06/2019 1331   GLUCOSEU Negative 11/29/2013 1720   HGBUR SMALL (A) 09/06/2019 1331   BILIRUBINUR NEGATIVE 09/06/2019 1331   BILIRUBINUR Negative 11/29/2013 1720   KETONESUR NEGATIVE 09/06/2019 1331   PROTEINUR 100 (A) 09/06/2019 1331   NITRITE NEGATIVE 09/06/2019 1331   LEUKOCYTESUR NEGATIVE 09/06/2019 1331   LEUKOCYTESUR Trace 11/29/2013 1720   Sepsis Labs Invalid input(s): PROCALCITONIN,  WBC,  LACTICIDVEN Microbiology Recent Results (from the past 240 hour(s))  SARS Coronavirus 2 by RT PCR (hospital order, performed in [facility] hospital lab) Nasopharyngeal Nasopharyngeal Swab     Status: None   Collection Time: 09/06/19  8:49 PM   Specimen: Nasopharyngeal Swab  Result Value Ref Range Status   SARS Coronavirus 2 NEGATIVE NEGATIVE Final    Comment: (NOTE) SARS-CoV-2 target nucleic acids are NOT DETECTED.  The SARS-CoV-2 RNA is generally detectable in upper and lower respiratory specimens during the acute phase of infection. The lowest concentration of SARS-CoV-2 viral copies this assay can detect is 250 copies / mL. A negative result does not preclude SARS-CoV-2 infection and should not be used as the sole basis for treatment or other patient management decisions.  A negative result  may occur with improper specimen collection / handling, submission of specimen other than nasopharyngeal swab, presence of viral mutation(s) within the areas targeted by this assay, and inadequate number of viral copies (<250 copies / mL). A negative result must be combined with clinical observations, patient history, and epidemiological information.  Fact Sheet for Patients:   11/07/19  Fact Sheet for Healthcare Providers: BoilerBrush.com.cy  This test is not yet approved or  cleared by the https://pope.com/ FDA and has been authorized for detection  and/or diagnosis of SARS-CoV-2 by FDA under an Emergency Use Authorization (EUA).  This EUA will remain in effect (meaning this test can be used) for the duration of the COVID-19 declaration under Section 564(b)(1) of the Act, 21 U.S.C. section 360bbb-3(b)(1), unless the authorization is terminated or revoked sooner.  Performed at [facility], [address] [place], [place] [address]   MRSA PCR Screening     Status: None   Collection Time: 09/06/19 10:18 PM   Specimen: Nasopharyngeal  Result Value Ref Range Status   MRSA by PCR NEGATIVE NEGATIVE Final    Comment:        The GeneXpert MRSA Assay (FDA approved for NASAL specimens only), is one component of a comprehensive MRSA colonization surveillance program. It is not intended to diagnose MRSA infection nor to guide or monitor treatment for MRSA infections. Performed at [facility], [address] [place], [place] [address]     Discharge Instructions     Discharge Instructions    Diet - low sodium heart healthy   Complete by: As directed    Discharge instructions   Complete by: As directed    - restart your home blood pressure medications and do not skip any doses, especially of the Clonidine - record your blood pressure daily and bring this to your next PCP appointment - get lab work this  week and follow up with your primary care physician If you have any significant change or worsening of your symptoms, do not hesitate to contact your primary care physician or return to the ED.   Increase activity slowly   Complete by: As directed      Allergies as of 09/08/2019   No Known Allergies     Medication List    STOP taking these medications   ibuprofen 200 MG tablet Commonly known as: ADVIL     TAKE these medications   acetaminophen 500 MG tablet Commonly known as: TYLENOL Take 500 mg by mouth every 6 (six) hours as needed.   aspirin EC 81 MG tablet Take 81 mg by mouth daily.   cloNIDine 0.1 MG tablet Commonly known as: CATAPRES Take 0.1 mg by mouth 3 (three) times daily.   fluticasone 50 MCG/ACT nasal spray Commonly known as: Flonase Place 1 spray into both nostrils 2 (two) times daily.   latanoprost 0.005 % ophthalmic solution Commonly known as: XALATAN Place 1 drop into both eyes at bedtime.   lisinopril 5 MG tablet Commonly known as: ZESTRIL Take 5 mg by mouth daily.   lovastatin 20 MG tablet Commonly known as: MEVACOR Take 20 mg by mouth at bedtime.   metoprolol succinate 25 MG 24 hr tablet Commonly known as: TOPROL-XL Take 1 tablet (25 mg total) by mouth daily. Start taking on: September 09, 2019 What changed: medication strength   omeprazole 20 MG capsule Commonly known as: PRILOSEC Take 20 mg by mouth daily.   ondansetron 4 MG disintegrating tablet Commonly known as: ZOFRAN-ODT Take 4 mg by mouth every 12 (twelve) hours as needed for nausea.   timolol 0.5 % ophthalmic solution Commonly known as: TIMOPTIC Place 1 drop into both eyes 2 (two) times daily.       No Known Allergies   Time coordinating discharge: Over 30 minutes  SIGNED:   Jae DireJared E Cayden Rautio, D.O. [facility] Pager: [phone]  09/08/2019, 5:49 PM

## 2020-12-12 DIAGNOSIS — Z79899 Other long term (current) drug therapy: Secondary | ICD-10-CM | POA: Diagnosis not present

## 2020-12-12 DIAGNOSIS — K219 Gastro-esophageal reflux disease without esophagitis: Secondary | ICD-10-CM | POA: Diagnosis not present

## 2020-12-12 DIAGNOSIS — R739 Hyperglycemia, unspecified: Secondary | ICD-10-CM | POA: Diagnosis not present

## 2020-12-12 DIAGNOSIS — I10 Essential (primary) hypertension: Secondary | ICD-10-CM | POA: Diagnosis not present

## 2020-12-12 DIAGNOSIS — E785 Hyperlipidemia, unspecified: Secondary | ICD-10-CM | POA: Diagnosis not present

## 2020-12-12 DIAGNOSIS — Z23 Encounter for immunization: Secondary | ICD-10-CM | POA: Diagnosis not present

## 2021-08-24 DIAGNOSIS — Z0100 Encounter for examination of eyes and vision without abnormal findings: Secondary | ICD-10-CM | POA: Diagnosis not present

## 2021-08-24 DIAGNOSIS — H401132 Primary open-angle glaucoma, bilateral, moderate stage: Secondary | ICD-10-CM | POA: Diagnosis not present

## 2021-08-26 DIAGNOSIS — Z0100 Encounter for examination of eyes and vision without abnormal findings: Secondary | ICD-10-CM | POA: Diagnosis not present

## 2022-03-08 DIAGNOSIS — H401132 Primary open-angle glaucoma, bilateral, moderate stage: Secondary | ICD-10-CM | POA: Diagnosis not present

## 2022-04-09 DIAGNOSIS — R7303 Prediabetes: Secondary | ICD-10-CM | POA: Diagnosis not present

## 2022-04-09 DIAGNOSIS — I10 Essential (primary) hypertension: Secondary | ICD-10-CM | POA: Diagnosis not present

## 2022-04-09 DIAGNOSIS — Z79899 Other long term (current) drug therapy: Secondary | ICD-10-CM | POA: Diagnosis not present

## 2022-04-09 DIAGNOSIS — K219 Gastro-esophageal reflux disease without esophagitis: Secondary | ICD-10-CM | POA: Diagnosis not present

## 2022-04-09 DIAGNOSIS — E785 Hyperlipidemia, unspecified: Secondary | ICD-10-CM | POA: Diagnosis not present

## 2022-06-08 DIAGNOSIS — H401132 Primary open-angle glaucoma, bilateral, moderate stage: Secondary | ICD-10-CM | POA: Diagnosis not present

## 2022-10-04 DIAGNOSIS — E7800 Pure hypercholesterolemia, unspecified: Secondary | ICD-10-CM | POA: Diagnosis not present

## 2022-10-04 DIAGNOSIS — R7303 Prediabetes: Secondary | ICD-10-CM | POA: Diagnosis not present

## 2022-10-04 DIAGNOSIS — Z79899 Other long term (current) drug therapy: Secondary | ICD-10-CM | POA: Diagnosis not present

## 2022-10-11 DIAGNOSIS — R7303 Prediabetes: Secondary | ICD-10-CM | POA: Diagnosis not present

## 2022-10-11 DIAGNOSIS — Z1331 Encounter for screening for depression: Secondary | ICD-10-CM | POA: Diagnosis not present

## 2022-10-11 DIAGNOSIS — I10 Essential (primary) hypertension: Secondary | ICD-10-CM | POA: Diagnosis not present

## 2022-10-11 DIAGNOSIS — R8290 Unspecified abnormal findings in urine: Secondary | ICD-10-CM | POA: Diagnosis not present

## 2022-10-11 DIAGNOSIS — E785 Hyperlipidemia, unspecified: Secondary | ICD-10-CM | POA: Diagnosis not present

## 2022-10-11 DIAGNOSIS — Z79899 Other long term (current) drug therapy: Secondary | ICD-10-CM | POA: Diagnosis not present

## 2022-10-11 DIAGNOSIS — Z0000 Encounter for general adult medical examination without abnormal findings: Secondary | ICD-10-CM | POA: Diagnosis not present

## 2022-10-12 DIAGNOSIS — H401132 Primary open-angle glaucoma, bilateral, moderate stage: Secondary | ICD-10-CM | POA: Diagnosis not present

## 2022-12-17 DIAGNOSIS — R0609 Other forms of dyspnea: Secondary | ICD-10-CM | POA: Diagnosis not present

## 2022-12-17 DIAGNOSIS — R002 Palpitations: Secondary | ICD-10-CM | POA: Diagnosis not present

## 2022-12-17 DIAGNOSIS — Z23 Encounter for immunization: Secondary | ICD-10-CM | POA: Diagnosis not present

## 2023-04-11 DIAGNOSIS — R8290 Unspecified abnormal findings in urine: Secondary | ICD-10-CM | POA: Diagnosis not present

## 2023-04-11 DIAGNOSIS — M545 Low back pain, unspecified: Secondary | ICD-10-CM | POA: Diagnosis not present

## 2023-04-11 DIAGNOSIS — R7303 Prediabetes: Secondary | ICD-10-CM | POA: Diagnosis not present

## 2023-04-11 DIAGNOSIS — Z79899 Other long term (current) drug therapy: Secondary | ICD-10-CM | POA: Diagnosis not present

## 2023-04-11 DIAGNOSIS — E7800 Pure hypercholesterolemia, unspecified: Secondary | ICD-10-CM | POA: Diagnosis not present

## 2023-04-18 DIAGNOSIS — N189 Chronic kidney disease, unspecified: Secondary | ICD-10-CM | POA: Diagnosis not present

## 2023-04-18 DIAGNOSIS — R7303 Prediabetes: Secondary | ICD-10-CM | POA: Diagnosis not present

## 2023-04-18 DIAGNOSIS — I129 Hypertensive chronic kidney disease with stage 1 through stage 4 chronic kidney disease, or unspecified chronic kidney disease: Secondary | ICD-10-CM | POA: Diagnosis not present

## 2023-04-18 DIAGNOSIS — E785 Hyperlipidemia, unspecified: Secondary | ICD-10-CM | POA: Diagnosis not present

## 2023-04-18 DIAGNOSIS — Z79899 Other long term (current) drug therapy: Secondary | ICD-10-CM | POA: Diagnosis not present

## 2023-04-18 DIAGNOSIS — R002 Palpitations: Secondary | ICD-10-CM | POA: Diagnosis not present

## 2023-04-20 DIAGNOSIS — Z0100 Encounter for examination of eyes and vision without abnormal findings: Secondary | ICD-10-CM | POA: Diagnosis not present

## 2023-04-20 DIAGNOSIS — H2513 Age-related nuclear cataract, bilateral: Secondary | ICD-10-CM | POA: Diagnosis not present

## 2023-04-20 DIAGNOSIS — H401132 Primary open-angle glaucoma, bilateral, moderate stage: Secondary | ICD-10-CM | POA: Diagnosis not present

## 2023-09-29 DIAGNOSIS — N3001 Acute cystitis with hematuria: Secondary | ICD-10-CM | POA: Diagnosis not present

## 2023-10-14 DIAGNOSIS — E7800 Pure hypercholesterolemia, unspecified: Secondary | ICD-10-CM | POA: Diagnosis not present

## 2023-10-14 DIAGNOSIS — Z118 Encounter for screening for other infectious and parasitic diseases: Secondary | ICD-10-CM | POA: Diagnosis not present

## 2023-10-14 DIAGNOSIS — I129 Hypertensive chronic kidney disease with stage 1 through stage 4 chronic kidney disease, or unspecified chronic kidney disease: Secondary | ICD-10-CM | POA: Diagnosis not present

## 2023-10-14 DIAGNOSIS — N1831 Chronic kidney disease, stage 3a: Secondary | ICD-10-CM | POA: Diagnosis not present

## 2023-10-14 DIAGNOSIS — Z87440 Personal history of urinary (tract) infections: Secondary | ICD-10-CM | POA: Diagnosis not present

## 2023-10-14 DIAGNOSIS — R7303 Prediabetes: Secondary | ICD-10-CM | POA: Diagnosis not present

## 2023-10-14 DIAGNOSIS — R8290 Unspecified abnormal findings in urine: Secondary | ICD-10-CM | POA: Diagnosis not present

## 2023-10-19 DIAGNOSIS — H401132 Primary open-angle glaucoma, bilateral, moderate stage: Secondary | ICD-10-CM | POA: Diagnosis not present

## 2023-10-21 DIAGNOSIS — Z0000 Encounter for general adult medical examination without abnormal findings: Secondary | ICD-10-CM | POA: Diagnosis not present

## 2023-10-21 DIAGNOSIS — Z1331 Encounter for screening for depression: Secondary | ICD-10-CM | POA: Diagnosis not present

## 2023-10-21 DIAGNOSIS — I10 Essential (primary) hypertension: Secondary | ICD-10-CM | POA: Diagnosis not present

## 2023-10-21 DIAGNOSIS — K219 Gastro-esophageal reflux disease without esophagitis: Secondary | ICD-10-CM | POA: Diagnosis not present

## 2023-10-21 DIAGNOSIS — M25561 Pain in right knee: Secondary | ICD-10-CM | POA: Diagnosis not present

## 2023-11-17 DIAGNOSIS — M542 Cervicalgia: Secondary | ICD-10-CM | POA: Diagnosis not present

## 2023-11-17 DIAGNOSIS — H9202 Otalgia, left ear: Secondary | ICD-10-CM | POA: Diagnosis not present

## 2023-11-17 DIAGNOSIS — R42 Dizziness and giddiness: Secondary | ICD-10-CM | POA: Diagnosis not present

## 2023-11-17 DIAGNOSIS — R0789 Other chest pain: Secondary | ICD-10-CM | POA: Diagnosis not present

## 2023-12-28 DIAGNOSIS — Z23 Encounter for immunization: Secondary | ICD-10-CM | POA: Diagnosis not present
